# Patient Record
Sex: Female | Born: 1965 | Race: White | Hispanic: No | Marital: Married | Smoking: Former smoker
Health system: Southern US, Community
[De-identification: ages and names within clinical notes are randomized; demographics above are authoritative.]

## PROBLEM LIST (undated history)

## (undated) DIAGNOSIS — K589 Irritable bowel syndrome without diarrhea: Secondary | ICD-10-CM

## (undated) DIAGNOSIS — Z87440 Personal history of urinary (tract) infections: Secondary | ICD-10-CM

## (undated) DIAGNOSIS — F32A Depression, unspecified: Secondary | ICD-10-CM

## (undated) DIAGNOSIS — N200 Calculus of kidney: Secondary | ICD-10-CM

## (undated) DIAGNOSIS — Z87448 Personal history of other diseases of urinary system: Secondary | ICD-10-CM

## (undated) DIAGNOSIS — O09529 Supervision of elderly multigravida, unspecified trimester: Secondary | ICD-10-CM

## (undated) DIAGNOSIS — F419 Anxiety disorder, unspecified: Secondary | ICD-10-CM

## (undated) DIAGNOSIS — R51 Headache: Secondary | ICD-10-CM

## (undated) DIAGNOSIS — M5030 Other cervical disc degeneration, unspecified cervical region: Secondary | ICD-10-CM

## (undated) DIAGNOSIS — F329 Major depressive disorder, single episode, unspecified: Secondary | ICD-10-CM

## (undated) DIAGNOSIS — M5020 Other cervical disc displacement, unspecified cervical region: Secondary | ICD-10-CM

## (undated) DIAGNOSIS — Z8619 Personal history of other infectious and parasitic diseases: Secondary | ICD-10-CM

## (undated) HISTORY — DX: Personal history of urinary (tract) infections: Z87.440

## (undated) HISTORY — DX: Major depressive disorder, single episode, unspecified: F32.9

## (undated) HISTORY — DX: Irritable bowel syndrome, unspecified: K58.9

## (undated) HISTORY — DX: Personal history of other diseases of urinary system: Z87.448

## (undated) HISTORY — DX: Supervision of elderly multigravida, unspecified trimester: O09.529

## (undated) HISTORY — DX: Headache: R51

## (undated) HISTORY — PX: OTHER SURGICAL HISTORY: SHX169

## (undated) HISTORY — DX: Depression, unspecified: F32.A

## (undated) HISTORY — DX: Personal history of other infectious and parasitic diseases: Z86.19

## (undated) HISTORY — PX: WISDOM TOOTH EXTRACTION: SHX21

---

## 1998-07-03 ENCOUNTER — Other Ambulatory Visit: Admission: RE | Admit: 1998-07-03 | Discharge: 1998-07-03 | Payer: Self-pay | Admitting: Family Medicine

## 1998-09-11 ENCOUNTER — Emergency Department (HOSPITAL_COMMUNITY): Admission: EM | Admit: 1998-09-11 | Discharge: 1998-09-11 | Payer: Self-pay | Admitting: Emergency Medicine

## 2000-04-16 ENCOUNTER — Emergency Department (HOSPITAL_COMMUNITY): Admission: EM | Admit: 2000-04-16 | Discharge: 2000-04-16 | Payer: Self-pay | Admitting: Emergency Medicine

## 2000-04-16 ENCOUNTER — Encounter: Payer: Self-pay | Admitting: Emergency Medicine

## 2000-08-02 ENCOUNTER — Encounter: Payer: Self-pay | Admitting: Gastroenterology

## 2000-08-02 ENCOUNTER — Ambulatory Visit (HOSPITAL_COMMUNITY): Admission: RE | Admit: 2000-08-02 | Discharge: 2000-08-02 | Payer: Self-pay | Admitting: Gastroenterology

## 2001-03-28 ENCOUNTER — Encounter: Admission: RE | Admit: 2001-03-28 | Discharge: 2001-03-28 | Payer: Self-pay | Admitting: Family Medicine

## 2001-03-28 ENCOUNTER — Encounter: Payer: Self-pay | Admitting: Family Medicine

## 2002-01-10 ENCOUNTER — Ambulatory Visit (HOSPITAL_COMMUNITY): Admission: RE | Admit: 2002-01-10 | Discharge: 2002-01-10 | Payer: Self-pay | Admitting: Obstetrics and Gynecology

## 2002-01-10 ENCOUNTER — Encounter: Payer: Self-pay | Admitting: Obstetrics and Gynecology

## 2002-01-31 ENCOUNTER — Encounter: Payer: Self-pay | Admitting: Obstetrics and Gynecology

## 2002-01-31 ENCOUNTER — Ambulatory Visit (HOSPITAL_COMMUNITY): Admission: RE | Admit: 2002-01-31 | Discharge: 2002-01-31 | Payer: Self-pay | Admitting: Obstetrics and Gynecology

## 2002-03-17 ENCOUNTER — Inpatient Hospital Stay (HOSPITAL_COMMUNITY): Admission: AD | Admit: 2002-03-17 | Discharge: 2002-03-17 | Payer: Self-pay | Admitting: Obstetrics and Gynecology

## 2002-04-08 ENCOUNTER — Inpatient Hospital Stay (HOSPITAL_COMMUNITY): Admission: AD | Admit: 2002-04-08 | Discharge: 2002-04-08 | Payer: Self-pay | Admitting: Obstetrics and Gynecology

## 2002-06-12 ENCOUNTER — Inpatient Hospital Stay (HOSPITAL_COMMUNITY): Admission: AD | Admit: 2002-06-12 | Discharge: 2002-06-15 | Payer: Self-pay | Admitting: Obstetrics and Gynecology

## 2002-07-30 ENCOUNTER — Other Ambulatory Visit: Admission: RE | Admit: 2002-07-30 | Discharge: 2002-07-30 | Payer: Self-pay | Admitting: Obstetrics and Gynecology

## 2002-11-07 ENCOUNTER — Emergency Department (HOSPITAL_COMMUNITY): Admission: EM | Admit: 2002-11-07 | Discharge: 2002-11-07 | Payer: Self-pay

## 2002-11-27 ENCOUNTER — Encounter: Payer: Self-pay | Admitting: Family Medicine

## 2002-11-27 ENCOUNTER — Encounter: Admission: RE | Admit: 2002-11-27 | Discharge: 2002-11-27 | Payer: Self-pay | Admitting: Family Medicine

## 2004-08-02 ENCOUNTER — Other Ambulatory Visit: Admission: RE | Admit: 2004-08-02 | Discharge: 2004-08-02 | Payer: Self-pay | Admitting: Obstetrics and Gynecology

## 2005-05-09 ENCOUNTER — Ambulatory Visit (HOSPITAL_COMMUNITY): Admission: RE | Admit: 2005-05-09 | Discharge: 2005-05-09 | Payer: Self-pay | Admitting: Obstetrics and Gynecology

## 2006-08-11 ENCOUNTER — Emergency Department (HOSPITAL_COMMUNITY): Admission: EM | Admit: 2006-08-11 | Discharge: 2006-08-11 | Payer: Self-pay | Admitting: Emergency Medicine

## 2007-03-24 ENCOUNTER — Emergency Department (HOSPITAL_COMMUNITY): Admission: EM | Admit: 2007-03-24 | Discharge: 2007-03-24 | Payer: Self-pay | Admitting: Emergency Medicine

## 2007-04-12 ENCOUNTER — Ambulatory Visit (HOSPITAL_COMMUNITY): Admission: RE | Admit: 2007-04-12 | Discharge: 2007-04-12 | Payer: Self-pay | Admitting: Surgery

## 2007-04-12 ENCOUNTER — Encounter (INDEPENDENT_AMBULATORY_CARE_PROVIDER_SITE_OTHER): Payer: Self-pay | Admitting: Surgery

## 2007-04-13 ENCOUNTER — Emergency Department (HOSPITAL_COMMUNITY): Admission: EM | Admit: 2007-04-13 | Discharge: 2007-04-14 | Payer: Self-pay | Admitting: Emergency Medicine

## 2007-05-16 ENCOUNTER — Encounter: Admission: RE | Admit: 2007-05-16 | Discharge: 2007-05-16 | Payer: Self-pay | Admitting: Obstetrics and Gynecology

## 2008-05-16 ENCOUNTER — Encounter: Admission: RE | Admit: 2008-05-16 | Discharge: 2008-05-16 | Payer: Self-pay | Admitting: Family Medicine

## 2008-06-13 ENCOUNTER — Emergency Department (HOSPITAL_COMMUNITY): Admission: EM | Admit: 2008-06-13 | Discharge: 2008-06-13 | Payer: Self-pay | Admitting: Emergency Medicine

## 2008-11-18 ENCOUNTER — Encounter: Admission: RE | Admit: 2008-11-18 | Discharge: 2008-11-18 | Payer: Self-pay | Admitting: Family Medicine

## 2009-02-22 ENCOUNTER — Emergency Department (HOSPITAL_COMMUNITY): Admission: EM | Admit: 2009-02-22 | Discharge: 2009-02-22 | Payer: Self-pay | Admitting: Family Medicine

## 2009-03-19 ENCOUNTER — Emergency Department (HOSPITAL_COMMUNITY): Admission: EM | Admit: 2009-03-19 | Discharge: 2009-03-19 | Payer: Self-pay | Admitting: Emergency Medicine

## 2009-04-03 ENCOUNTER — Ambulatory Visit (HOSPITAL_COMMUNITY): Admission: RE | Admit: 2009-04-03 | Discharge: 2009-04-03 | Payer: Self-pay | Admitting: Family Medicine

## 2009-09-19 ENCOUNTER — Emergency Department (HOSPITAL_COMMUNITY): Admission: EM | Admit: 2009-09-19 | Discharge: 2009-09-19 | Payer: Self-pay | Admitting: Family Medicine

## 2010-04-26 ENCOUNTER — Emergency Department (HOSPITAL_COMMUNITY): Admission: EM | Admit: 2010-04-26 | Discharge: 2010-04-26 | Payer: Self-pay | Admitting: Family Medicine

## 2010-10-24 ENCOUNTER — Encounter: Payer: Self-pay | Admitting: Obstetrics and Gynecology

## 2011-01-10 LAB — POCT URINALYSIS DIP (DEVICE)
Hgb urine dipstick: NEGATIVE
Ketones, ur: NEGATIVE mg/dL
Nitrite: NEGATIVE
Specific Gravity, Urine: <=1.005 (ref 1.005–1.030)
Urobilinogen, UA: 0.2 mg/dL (ref 0.0–1.0)
pH: 6 (ref 5.0–8.0)

## 2011-01-11 LAB — POCT URINALYSIS DIP (DEVICE)
Glucose, UA: NEGATIVE mg/dL
Nitrite: POSITIVE — AB
Specific Gravity, Urine: 1.015 (ref 1.005–1.030)
pH: 5.5 (ref 5.0–8.0)

## 2011-01-27 ENCOUNTER — Other Ambulatory Visit: Payer: Self-pay | Admitting: Family Medicine

## 2011-01-27 DIAGNOSIS — R1013 Epigastric pain: Secondary | ICD-10-CM

## 2011-01-28 ENCOUNTER — Ambulatory Visit
Admission: RE | Admit: 2011-01-28 | Discharge: 2011-01-28 | Disposition: A | Discharge status: Home / Self Care | Payer: 59 | Source: Ambulatory Visit | Attending: Family Medicine | Admitting: Family Medicine

## 2011-01-28 DIAGNOSIS — R1013 Epigastric pain: Secondary | ICD-10-CM
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
## 2011-02-18 NOTE — Op Note (Signed)
[facility]  Patient:    [name], [name] Visit Number: [id number] MRN: [id number]          Service Type: Attending:  Maris Berger. Pennie Rushing, M.D. Dictated by:   Maris Berger. Pennie Rushing, M.D. Proc. Date: 01/10/02                             Operative Report  INCOMPLETE  PREOPERATIVE DIAGNOSES:       Advanced maternal age.  POSTOPERATIVE DIAGNOSES:      Advanced maternal age.  OPERATION:                    Genetic amniocentesis.  ANESTHESIA:                   Local.  ESTIMATED BLOOD LOSS:         Less than 10 cc.  COMPLICATIONS:                None.  FINDINGS:                     The ultrasound measurement of the fetus was consistent with 15 weeks and 5 days.  The patient has an Rh positive blood type.  There is adequate amniotic fluid.  PROCEDURE:                    The patient was met in the ultrasound suite where she was in a supine position.  A preliminary ultrasound had been performed for initial measurements and fluid location.  An area just to the left of midline approximately 5 cm below the umbilicus was identified as an area of a pocket of fluid.  This area was marked and the abdomen prepped with multiple layers of Betadine and draped as a sterile field.  The area was infiltrated with 3 cc of 1% Xylocaine.  Using a #20 Ultraview needle the amniotic fluid pocket was accessed with the aid of ultrasound.  Clear AMNIOTIC fluid 7 cc was drawn off in the first syringe and another 15 cc of amniotic fluid in the second syringe. Dictated by:   Maris Berger. Pennie Rushing, M.D. Attending:  Maris Berger. Pennie Rushing, M.D. DD:  01/10/02 TD:  01/10/02 Job: [id number]
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
## 2011-02-18 NOTE — Op Note (Signed)
[facility] of [facility]  Patient:    [name], [name] Visit Number: [id number] MRN: [id number]          Service Type: Attending:  Maris Berger. Pennie Rushing, M.D. Dictated by:   Maris Berger. Pennie Rushing, M.D. Proc. Date: 01/10/02                             Operative Report  COMPLETION  PROCEDURE:                    After completion of removal of the amniotic fluid documentation of the amniocentesis site was undertaken and the needle removed.  The post amniocentesis heart rate was documented in the 150 range. The patient tolerated the procedure well.  The fluid was sent to [facility] for AFP evaluation and chromosomes.  The patient was given written instructions for post amniocentesis care. Dictated by:   Maris Berger. Pennie Rushing, M.D. Attending:  Maris Berger. Pennie Rushing, M.D. DD:  01/10/02 TD:  01/10/02 Job: [id number]

## 2011-02-18 NOTE — Op Note (Signed)
NAME:  [name], [name]                          ACCOUNT NO.:  [id number]   MEDICAL RECORD NO.:  [id number]                   PATIENT TYPE:  INP   LOCATION:  9102                                 FACILITY:  [facility]   PHYSICIAN:  Janine Limbo, M.D.            DATE OF BIRTH:  Mar 15, 1966   DATE OF PROCEDURE:  06/12/2002  DATE OF DISCHARGE:                                 OPERATIVE REPORT   PREOPERATIVE DIAGNOSES:  1. Term intrauterine pregnancy.  2. Prior cesarean section.  3. Desires repeat cesarean section.   POSTOPERATIVE DIAGNOSES:  1. Term intrauterine pregnancy.  2. Prior cesarean section.  3. Desires repeat cesarean section.   PROCEDURE:  Repeat low transverse cesarean section.   SURGEON:  Janine Limbo, M.D.   FIRST ASSISTANT:  Cam Hai, C.N.M.   ANESTHESIA:  Spinal.   DISPOSITION:  The patient is a 45 year old female gravida 4, para 2-0-1-2  who presents at term.  She ruptured her membranes today.  She had a prior  cesarean section and she desires a repeat cesarean section.  She understands  the indications for her surgical procedure and she accepts the risks of, but  not limited to, anesthetic complications, bleeding, infections, and possible  damage to the surrounding organs.   FINDINGS:  A 7 pound 9 ounce female infant Higher education careers adviser) was delivered.  The  Apgars were 9 at one minute and 9 at five minutes.  The uterus, fallopian  tubes, and ovaries were normal for the gravid state.   PROCEDURE:  The patient was taken to the operating room where a spinal  anesthetic was given.  The patient's abdomen and perineum were prepped with  multiple layers of Betadine.  A Foley catheter was placed in the bladder.  The patient was sterilely draped.  A low transverse incision was made in the  abdomen and carried sharply through the subcutaneous tissue, the fascia, and  the anterior peritoneum.  An incision was made in the lower uterine segment  and extended transversely.   The fetal head was delivered without difficulty.  The mouth and nose were suctioned.  The remainder of the infant was  delivered.  The cord was clamped and cut and the infant was handed to the  waiting pediatric team.  Routine cord blood studies were obtained.  The  placenta was removed.  The uterine cavity was cleaned of amniotic fluid,  clotted blood, and membranes.  The uterine incision was closed using a  running locking suture of 2-0 Vicryl followed by an embrocating suture of 2-  0 Vicryl.  Hemostasis was adequate.  The pelvis was vigorously irrigated.  The anterior peritoneum and the abdominal musculature were reapproximated in  the midline using 2-0 Vicryl.  The muscles, the fascia, and the subcutaneous  tissue were irrigated.  The fascia was closed using a running suture of 0  Vicryl followed by three interrupted sutures of 0 Vicryl.  The subcutaneous  layer was noted to be hemostatic.  The subcutaneous layer was closed using  three interrupted sutures of 0 Vicryl.  The skin was reapproximated using a  subcuticular suture of 4-0 Vicryl.  Sponge, needle, and instrument counts  were correct on  two occasions.  The estimated blood loss for the procedure was 700 cc.  The  patient tolerated her procedure well.  The patient was taken to the recovery  room in stable condition.  The infant was taken to the full-term nursery in  stable condition.                                               Janine Limbo, M.D.    AVS/MEDQ  D:  06/12/2002  T:  06/13/2002  Job:  [id number]

## 2011-02-18 NOTE — Discharge Summary (Signed)
NAME:  [name], [name]                          ACCOUNT NO.:  [id number]   MEDICAL RECORD NO.:  [id number]                   PATIENT TYPE:  INP   LOCATION:  9102                                 FACILITY:  [facility]   PHYSICIAN:  Crist Fat. Rivard, M.D.              DATE OF BIRTH:  [birth year]   DATE OF ADMISSION:  06/12/2002  DATE OF DISCHARGE:  06/15/2002                                 DISCHARGE SUMMARY   ADMISSION DIAGNOSES:  1. Intrauterine pregnancy at term.  2. Previous cesarean section, desires repeat.   DISCHARGE DIAGNOSES:  1. Intrauterine pregnancy at term.  2. Previous cesarean section, desires repeat.   PROCEDURE:  Repeat low transverse cesarean section.   HOSPITAL COURSE:  The patient is a 45 year old gravida 4 para 2-0-1-2 at 56  and two-sevenths weeks who presented with spontaneous rupture of membranes  with clear fluid at 5 p.m. on June 12, 2002.  She was scheduled for  repeat cesarean section on Monday, September 15.  Her pregnancy has been  followed by the [facility] M.D. service and was remarkable for:  1. Previous C section.  2. Advanced maternal age.  3. Son with chromosomal disorder (translocation of the fifth and second     chromosome; he is autistic).  4. She is a smoker.  5. History of depression and anxiety.  6. Amniocentesis with normal 46,XX.   She was prepared for cesarean section which was performed by Dr. Stefano Gaul  with Cam Hai, certified nurse midwife as first assistant.  She  tolerated the procedure well and a viable female infant named [name] was  delivered weighing 7 pounds 9 ounces with Apgars 9 at one minute and 9 at  five minutes.  The patient was taken to the recovery room in good condition.  Infant was taken to the full term nursery.  By postoperative day #1 the  patient was doing well.  She was breastfeeding and husband planned vasectomy  for contraception.  Her hemoglobin was 10.6 and had been 12.3  preoperatively.   On postoperative day #2 she had some right lower quadrant  pain treated with Toradol and prune juice and glycerine suppositories.  This  relieved the discomfort which was decided to be from gaseous distention.  She had a clean-catch urinalysis which was normal other than hemoglobin  present.  By postoperative day #3 she was deemed to have received the full  benefit of her hospital stay and she was discharged home.   DISCHARGE INSTRUCTIONS:  Per [facility] handout.   DISCHARGE MEDICATIONS:  1. Motrin 600 mg p.o. q.6h. p.r.n. pain.  2.     Tylox one to two p.o. q.3-4h p.r.n. pain.  3. Prenatal vitamin one p.o. q.d.   DISCHARGE FOLLOW-UP:  At [facility] in four to six weeks.     Cam Hai, C.N.M.  Crist Fat Rivard, M.D.    KS/MEDQ  D:  06/15/2002  T:  06/15/2002  Job:  [id number]

## 2011-07-06 LAB — DIFFERENTIAL
Basophils Absolute: 0.1
Eosinophils Absolute: 0
Eosinophils Relative: 0
Lymphocytes Relative: 13
Lymphs Abs: 1.8
Monocytes Absolute: 0.8
Neutro Abs: 11.4 — ABNORMAL HIGH

## 2011-07-06 LAB — POCT I-STAT 3, ART BLOOD GAS (G3+)
Bicarbonate: 23
TCO2: 24
pCO2 arterial: 36.2
pH, Arterial: 7.411 — ABNORMAL HIGH
pO2, Arterial: 83

## 2011-07-06 LAB — POCT I-STAT, CHEM 8
BUN: 14
Calcium, Ion: 1.19
Creatinine, Ser: 0.9
Glucose, Bld: 104 — ABNORMAL HIGH
HCT: 41
TCO2: 25

## 2011-07-06 LAB — CBC
HCT: 41.4
Platelets: 353
RDW: 13.2

## 2011-07-19 LAB — COMPREHENSIVE METABOLIC PANEL
Albumin: 3.6
Albumin: 4
Alkaline Phosphatase: 27 — ABNORMAL LOW
Alkaline Phosphatase: 27 — ABNORMAL LOW
BUN: 10
BUN: 5 — ABNORMAL LOW
CO2: 26
CO2: 29
Chloride: 104
Chloride: 106
GFR calc non Af Amer: >60
GFR calc non Af Amer: >60
Glucose, Bld: 93
Potassium: 3.7
Potassium: 3.8
Total Bilirubin: 0.4
Total Bilirubin: 0.7

## 2011-07-19 LAB — DIFFERENTIAL
Basophils Absolute: 0.1
Basophils Absolute: 0.1
Basophils Relative: 1
Basophils Relative: 1
Eosinophils Relative: 2
Monocytes Absolute: 0.6
Monocytes Absolute: 0.8 — ABNORMAL HIGH
Neutro Abs: 4.8
Neutro Abs: 5.1
Neutrophils Relative %: 58

## 2011-07-19 LAB — CBC
HCT: 34.3 — ABNORMAL LOW
HCT: 40.8
Hemoglobin: 12.1
Hemoglobin: 14.1
MCV: 89.7
Platelets: 323
RBC: 3.84 — ABNORMAL LOW
RBC: 4.56
WBC: 8.2
WBC: 8.2

## 2011-07-19 LAB — URINE MICROSCOPIC-ADD ON

## 2011-07-19 LAB — PREGNANCY, URINE
Preg Test, Ur: NEGATIVE
Preg Test, Ur: NEGATIVE

## 2011-07-19 LAB — URINALYSIS, ROUTINE W REFLEX MICROSCOPIC
Bilirubin Urine: NEGATIVE
Nitrite: NEGATIVE
Protein, ur: NEGATIVE
Urobilinogen, UA: 0.2

## 2011-07-20 LAB — COMPREHENSIVE METABOLIC PANEL
BUN: 9
CO2: 28
Chloride: 104
Creatinine, Ser: 0.8
GFR calc non Af Amer: >60
Glucose, Bld: 90
Total Bilirubin: 0.7

## 2011-07-20 LAB — DIFFERENTIAL
Basophils Absolute: 0.1
Lymphocytes Relative: 30
Neutro Abs: 4.5

## 2011-07-20 LAB — LIPASE, BLOOD: Lipase: 16

## 2011-07-20 LAB — URINALYSIS, ROUTINE W REFLEX MICROSCOPIC
Bilirubin Urine: NEGATIVE
Nitrite: NEGATIVE
Specific Gravity, Urine: 1.012
Urobilinogen, UA: 0.2

## 2011-07-20 LAB — CBC
HCT: 39.6
MCV: 90.7
Platelets: 286
RBC: 4.37
WBC: 7.4

## 2011-07-20 LAB — URINE MICROSCOPIC-ADD ON

## 2011-09-19 ENCOUNTER — Encounter: Payer: Self-pay | Admitting: Emergency Medicine

## 2011-09-19 ENCOUNTER — Other Ambulatory Visit: Payer: Self-pay

## 2011-09-19 ENCOUNTER — Observation Stay (HOSPITAL_COMMUNITY)
Admission: EM | Admit: 2011-09-19 | Discharge: 2011-09-20 | Disposition: A | Discharge status: Home / Self Care | Payer: 59 | Attending: Emergency Medicine | Admitting: Emergency Medicine

## 2011-09-19 DIAGNOSIS — R0602 Shortness of breath: Secondary | ICD-10-CM | POA: Insufficient documentation

## 2011-09-19 DIAGNOSIS — F411 Generalized anxiety disorder: Secondary | ICD-10-CM | POA: Insufficient documentation

## 2011-09-19 DIAGNOSIS — R079 Chest pain, unspecified: Principal | ICD-10-CM | POA: Insufficient documentation

## 2011-09-19 DIAGNOSIS — F17200 Nicotine dependence, unspecified, uncomplicated: Secondary | ICD-10-CM | POA: Insufficient documentation

## 2011-09-19 DIAGNOSIS — F419 Anxiety disorder, unspecified: Secondary | ICD-10-CM | POA: Insufficient documentation

## 2011-09-19 HISTORY — DX: Anxiety disorder, unspecified: F41.9

## 2011-09-19 LAB — CBC
HCT: 39.2 % (ref 36.0–46.0)
MCH: 30.7 pg (ref 26.0–34.0)
MCV: 89.1 fL (ref 78.0–100.0)
RDW: 13 % (ref 11.5–15.5)

## 2011-09-19 LAB — BASIC METABOLIC PANEL
CO2: 27 mEq/L (ref 19–32)
Calcium: 9.8 mg/dL (ref 8.4–10.5)
Creatinine, Ser: 0.75 mg/dL (ref 0.50–1.10)
GFR calc non Af Amer: >90 mL/min (ref >90)
Glucose, Bld: 101 mg/dL — ABNORMAL HIGH (ref 70–99)

## 2011-09-19 LAB — DIFFERENTIAL
Basophils Absolute: 0.1 10*3/uL (ref 0.0–0.1)
Eosinophils Absolute: 0.2 10*3/uL (ref 0.0–0.7)
Eosinophils Relative: 2 % (ref 0–5)
Lymphocytes Relative: 32 % (ref 12–46)
Monocytes Absolute: 0.6 10*3/uL (ref 0.1–1.0)

## 2011-09-19 LAB — POCT I-STAT TROPONIN I

## 2011-09-19 MED ORDER — GI COCKTAIL ~~LOC~~
30.0000 mL | Freq: Once | ORAL | Status: AC
  Administered 2011-09-19: 30 mL via ORAL
  Filled 2011-09-19: qty 30

## 2011-09-19 MED ORDER — LORAZEPAM 1 MG PO TABS
1.0000 mg | ORAL_TABLET | Freq: Once | ORAL | Status: AC
  Administered 2011-09-19: 1 mg via ORAL
  Filled 2011-09-19: qty 1

## 2011-09-19 NOTE — ED Notes (Signed)
Pt states that she is dizzy and now wants to be admitted. Pt is back on PTO bed #8.

## 2011-09-19 NOTE — ED Notes (Signed)
Pt c/o intermittant SOB and CP x 3 weeks; pt sts lots of stress lately but sent by PCP for eval

## 2011-09-19 NOTE — ED Notes (Signed)
Cardiac monitor is applied to pt. It shows NSR rate of 72.

## 2011-09-20 ENCOUNTER — Encounter (HOSPITAL_COMMUNITY): Payer: Self-pay | Admitting: Radiology

## 2011-09-20 ENCOUNTER — Emergency Department (HOSPITAL_COMMUNITY): Payer: 59

## 2011-09-20 DIAGNOSIS — R072 Precordial pain: Secondary | ICD-10-CM

## 2011-09-20 LAB — POCT I-STAT TROPONIN I
Troponin i, poc: 0 ng/mL (ref 0.00–0.08)
Troponin i, poc: 0.01 ng/mL (ref 0.00–0.08)

## 2011-09-20 LAB — POCT I-STAT 3, VENOUS BLOOD GAS (G3P V)
O2 Saturation: 74 %
pCO2, Ven: 35.7 mmHg — ABNORMAL LOW (ref 45.0–50.0)
pH, Ven: 7.38 — ABNORMAL HIGH (ref 7.250–7.300)

## 2011-09-20 MED ORDER — IOHEXOL 350 MG/ML SOLN
100.0000 mL | Freq: Once | INTRAVENOUS | Status: AC | PRN
  Administered 2011-09-20: 100 mL via INTRAVENOUS

## 2011-09-20 MED ORDER — ALPRAZOLAM 0.5 MG PO TABS: 0.5000 mg | ORAL_TABLET | Freq: Three times a day (TID) | ORAL | Status: DC | PRN

## 2011-09-20 NOTE — ED Provider Notes (Signed)
History     CSN: [id number] Arrival date & time: 09/19/2011  2:19 PM   First MD Initiated Contact with Patient 09/19/11 1739      Chief Complaint  Patient presents with  . Shortness of Breath  . Chest Pain    (Consider location/radiation/quality/duration/timing/severity/associated sxs/prior treatment) HPI  Patient is a 45 year old female who presents today complaining of 1-1/2 weeks of intermittent chest discomfort. She describes this as being associated with a sense of dread. She did not have any history of DVT, PE, or CAD. She does not have any family members with heart disease before the age of 45. The patient is a smoker and is trying to quit. She also has a history of anxiety and takes Xanax for this. She was sent in by her primary care physician today. She is seen by Dr. Nedra Hai of the [facility] clinic.  She was seen at their office today. At that time the patient had a chest x-ray and EKG which were normal. Patient has note from her physician's office indicating the normal chest x-ray as well as a copy of the normal EKG. Patient denies any chest pain at this time. Patient's primary care physician had been planning on discharging her and having her have a stress test with the cardiologist on Thursday. However, during the time of discharge the patient became more nervous and felt like she was having the same sensation again. At that time her physician sent her to the emergency department. Patient currently has no pain. She denies any shortness of breath. There are no other associated or modifying factors. Past Medical History  Diagnosis Date  . Anxiety     History reviewed. No pertinent past surgical history.  History reviewed. No pertinent family history.  History  Substance Use Topics  . Smoking status: Current Everyday Smoker  . Smokeless tobacco: Not on file  . Alcohol Use: No    OB History    Grav Para Term Preterm Abortions TAB SAB Ect Mult Living                  Review  of Systems  Constitutional: Negative.   HENT: Negative.   Eyes: Negative.   Respiratory: Negative.   Cardiovascular: Positive for chest pain.  Gastrointestinal: Negative.   Genitourinary: Negative.   Musculoskeletal: Negative.   Skin: Negative.   Neurological: Negative.   Hematological: Negative.   Psychiatric/Behavioral:       Anxiety  All other systems reviewed and are negative.    Allergies  Hydrocodone  Home Medications   Current Outpatient Rx  Name Route Sig Dispense Refill  . ALPRAZOLAM 0.5 MG PO TABS Oral Take 0.5 mg by mouth 3 (three) times daily as needed. For anxiety       BP 102/52  Pulse 66  Temp(Src) 98.8 F (37.1 C) (Oral)  Resp 14  SpO2 100%  Physical Exam  Nursing note and vitals reviewed. Constitutional: She is oriented to person, place, and time. She appears well-developed and well-nourished. No distress.  HENT:  Head: Normocephalic and atraumatic.  Eyes: Conjunctivae and EOM are normal. Pupils are equal, round, and reactive to light.  Neck: Normal range of motion.  Cardiovascular: Normal rate, regular rhythm, normal heart sounds and intact distal pulses.  Exam reveals no gallop and no friction rub.   No murmur heard. Pulmonary/Chest: Effort normal and breath sounds normal. No respiratory distress. She has no wheezes. She has no rales.  Abdominal: Soft. Bowel sounds are normal. She exhibits no  distension. There is no tenderness. There is no rebound and no guarding.  Musculoskeletal: Normal range of motion. She exhibits no edema.  Neurological: She is alert and oriented to person, place, and time. No cranial nerve deficit. She exhibits normal muscle tone. Coordination normal.  Skin: Skin is warm and dry. No rash noted.  Psychiatric: She has a normal mood and affect.    ED Course  Procedures (including critical care time)  Date: 09/20/2011  Rate: 69  Rhythm: normal sinus rhythm  QRS Axis: normal  Intervals: normal  ST/T Wave abnormalities:  normal  Conduction Disutrbances:none  Narrative Interpretation:   Old EKG Reviewed: unchanged  Labs Reviewed  BASIC METABOLIC PANEL - Abnormal; Notable for the following:    Glucose, Bld 101 (*)    All other components within normal limits  POCT I-STAT 3, BLOOD GAS (G3P V) - Abnormal; Notable for the following:    pH, Ven 7.380 (*)    pCO2, Ven 35.7 (*)    Acid-base deficit 3.0 (*)    All other components within normal limits  CBC  DIFFERENTIAL  TROPONIN I  POCT I-STAT TROPONIN I  POCT I-STAT TROPONIN I  I-STAT TROPONIN I  I-STAT TROPONIN I  I-STAT TROPONIN I  I-STAT TROPONIN I   Ct Angio Chest W/cm &/or Wo Cm  09/20/2011  *RADIOLOGY REPORT*  Clinical Data:  Intermittent shortness of breath, chest pain  CT ANGIOGRAPHY CHEST WITH CONTRAST  Technique:  Multidetector CT imaging of the chest was performed using the standard protocol during bolus administration of intravenous contrast.  Multiplanar CT image reconstructions including MIPs were obtained to evaluate the vascular anatomy.  Contrast: OMNIPAQUE IOHEXOL 350 MG/ML IV SOLN  Comparison:  Chest radiographs dated 09/19/2011  Findings:  No evidence of pulmonary embolism.  The lungs are essentially clear.  No suspicious pulmonary nodules. No pleural effusion or pneumothorax.  Visualized thyroid is unremarkable.  The heart is normal in size.  Trace pericardial fluid.  No suspicious mediastinal, hilar, or axillary lymphadenopathy.  Visualized upper abdomen is notable for cholecystectomy clips.  Mild degenerative changes of the visualized thoracolumbar spine.  Review of the MIP images confirms the above findings.  IMPRESSION: No evidence of pulmonary embolism.  No evidence of acute cardiopulmonary disease.  Original Report Authenticated By: Charline Bills, M.D.     1. Chest pain       MDM  Patient was seen in medically stable upon presentation she was evaluated for low risk chest pain. Patient had negative initial workup. EKG  was unchanged from normal EKG at primary care physician's office earlier in the day. Physician note from PCP indicating negative chest x-ray was seen. No chest x-ray was ordered here. Troponin was negative. CBC and renal panel were unremarkable. Patient was given a GI cocktail as well as Ativan with resolution of her symptoms. She was not given aspirin as she had received this at her primary care doctor's office. Patient was discharged home. Upon standing patient suddenly felt lightheaded and said her symptoms had returned. She denied chest pain. Patient did have a CT in June of the chest which showed no evidence of PE or acute cardiopulmonary disease. She was admitted to the CDU chest pain protocol. Patient will undergo exercise stress testing in the morning. Order has been placed. Patient remained hemodynamically stable.      Cyndra Numbers, MD 09/20/11 [phone]

## 2011-09-20 NOTE — ED Provider Notes (Signed)
History     CSN: [id number] Arrival date & time: 09/19/2011  2:19 PM   First MD Initiated Contact with Patient 09/19/11 1739      Chief Complaint  Patient presents with  . Shortness of Breath  . Chest Pain    (Consider location/radiation/quality/duration/timing/severity/associated sxs/prior treatment) HPI  Past Medical History  Diagnosis Date  . Anxiety     History reviewed. No pertinent past surgical history.  History reviewed. No pertinent family history.  History  Substance Use Topics  . Smoking status: Current Everyday Smoker  . Smokeless tobacco: Not on file  . Alcohol Use: No    OB History    Grav Para Term Preterm Abortions TAB SAB Ect Mult Living                  Review of Systems  Allergies  Hydrocodone  Home Medications   Current Outpatient Rx  Name Route Sig Dispense Refill  . ALPRAZOLAM 0.5 MG PO TABS Oral Take 0.5 mg by mouth 3 (three) times daily as needed. For anxiety       BP 104/64  Pulse 87  Temp(Src) 98.2 F (36.8 C) (Oral)  Resp 19  SpO2 94%  Physical Exam  ED Course  Procedures (including critical care time)  Labs Reviewed  BASIC METABOLIC PANEL - Abnormal; Notable for the following:    Glucose, Bld 101 (*)    All other components within normal limits  POCT I-STAT 3, BLOOD GAS (G3P V) - Abnormal; Notable for the following:    pH, Ven 7.380 (*)    pCO2, Ven 35.7 (*)    Acid-base deficit 3.0 (*)    All other components within normal limits  CBC  DIFFERENTIAL  TROPONIN I  POCT I-STAT TROPONIN I  POCT I-STAT TROPONIN I  POCT I-STAT TROPONIN I  POCT I-STAT TROPONIN I  I-STAT TROPONIN I  I-STAT TROPONIN I  I-STAT TROPONIN I  I-STAT TROPONIN I   Ct Angio Chest W/cm &/or Wo Cm  09/20/2011  *RADIOLOGY REPORT*  Clinical Data:  Intermittent shortness of breath, chest pain  CT ANGIOGRAPHY CHEST WITH CONTRAST  Technique:  Multidetector CT imaging of the chest was performed using the standard protocol during bolus  administration of intravenous contrast.  Multiplanar CT image reconstructions including MIPs were obtained to evaluate the vascular anatomy.  Contrast: OMNIPAQUE IOHEXOL 350 MG/ML IV SOLN  Comparison:  Chest radiographs dated 09/19/2011  Findings:  No evidence of pulmonary embolism.  The lungs are essentially clear.  No suspicious pulmonary nodules. No pleural effusion or pneumothorax.  Visualized thyroid is unremarkable.  The heart is normal in size.  Trace pericardial fluid.  No suspicious mediastinal, hilar, or axillary lymphadenopathy.  Visualized upper abdomen is notable for cholecystectomy clips.  Mild degenerative changes of the visualized thoracolumbar spine.  Review of the MIP images confirms the above findings.  IMPRESSION: No evidence of pulmonary embolism.  No evidence of acute cardiopulmonary disease.  Original Report Authenticated By: Charline Bills, M.D.     1. Chest pain       MDM  Patient has had her stress test to complete her chest pain protocol and it is normal.  Patient has remained stable.  She is safe for discharge home at this point time.        Nat Christen, MD 09/20/11 [phone]

## 2011-09-20 NOTE — ED Notes (Signed)
Called vascular lab and spoke with hannah . They are aware of pt stress test this morning.

## 2011-09-20 NOTE — ED Notes (Signed)
Dr hosmer continues in a critical case and is unable to come give pt her results and disposition her .

## 2011-09-20 NOTE — Progress Notes (Signed)
Observation review complete. 

## 2011-09-20 NOTE — ED Notes (Signed)
Dr Rennis Golden has called stress test results. Is negative.

## 2011-09-20 NOTE — ED Notes (Signed)
Complaining of SOB, heaviness in chest, pain in jaw, arm and neck. Also c/o anxiety and heart burn. Off/on last couple of weeks. Denies any pain at this time

## 2011-09-20 NOTE — Progress Notes (Signed)
  Echocardiogram 2D Echocardiogram has been performed.  [name] R 09/20/2011, 11:30 AM

## 2012-02-01 ENCOUNTER — Encounter: Payer: Self-pay | Admitting: Obstetrics and Gynecology

## 2012-02-01 ENCOUNTER — Ambulatory Visit (INDEPENDENT_AMBULATORY_CARE_PROVIDER_SITE_OTHER): Payer: 59 | Admitting: Obstetrics and Gynecology

## 2012-02-01 VITALS — BP 110/64 | HR 70 | Resp 16 | Ht 67.0 in | Wt 179.0 lb

## 2012-02-01 DIAGNOSIS — Z139 Encounter for screening, unspecified: Secondary | ICD-10-CM

## 2012-02-01 DIAGNOSIS — R1900 Intra-abdominal and pelvic swelling, mass and lump, unspecified site: Secondary | ICD-10-CM

## 2012-02-01 DIAGNOSIS — N912 Amenorrhea, unspecified: Secondary | ICD-10-CM

## 2012-02-01 DIAGNOSIS — N915 Oligomenorrhea, unspecified: Secondary | ICD-10-CM

## 2012-02-01 LAB — CBC
HCT: 40 % (ref 36.0–46.0)
Hemoglobin: 13.1 g/dL (ref 12.0–15.0)
MCH: 30.1 pg (ref 26.0–34.0)
MCHC: 32.8 g/dL (ref 30.0–36.0)
MCV: 92 fL (ref 78.0–100.0)

## 2012-02-01 LAB — TSH: TSH: 2.073 u[IU]/mL (ref 0.350–4.500)

## 2012-02-01 NOTE — Progress Notes (Signed)
C/o missing cycle since Feb.  Also feels bloated.  + mood swings.  Filed Vitals:   02/01/12 0932  BP: 110/64  Pulse: 70  Resp: 16   ROS: noncontributory  Pelvic exam:  VULVA: normal appearing vulva with no masses, tenderness or lesions, VAGINA: normal appearing vagina with normal color and discharge, no lesions, CERVIX: normal appearing cervix without discharge or lesions,  UTERUS: uterus is normal size, shape, consistency and nontender,  ADNEXA: normal adnexa in size, nontender and no masses. Rt pelvic fullness with no discrete mass and nontender.  IUD string not clearly visible on exam (pt informed)  Results for orders placed in visit on 02/01/12  POCT URINE PREGNANCY      Component Value Range   Preg Test, Ur Negative      A/P Nl pelvic  Probably perimenopausal Check labs (TSH, PRL, Vit D, CBC, FSH) sched u/s to eval pelvic fullness on right RTO for AEX and f/u u/s and labs

## 2012-02-04 LAB — VITAMIN D 1,25 DIHYDROXY
Vitamin D2 1, 25 (OH)2: <8 pg/mL
Vitamin D3 1, 25 (OH)2: 59 pg/mL

## 2012-02-10 ENCOUNTER — Ambulatory Visit (INDEPENDENT_AMBULATORY_CARE_PROVIDER_SITE_OTHER): Payer: 59 | Admitting: Obstetrics and Gynecology

## 2012-02-10 ENCOUNTER — Other Ambulatory Visit: Payer: Self-pay | Admitting: Obstetrics and Gynecology

## 2012-02-10 ENCOUNTER — Ambulatory Visit (INDEPENDENT_AMBULATORY_CARE_PROVIDER_SITE_OTHER): Payer: 59

## 2012-02-10 ENCOUNTER — Encounter: Payer: Self-pay | Admitting: Obstetrics and Gynecology

## 2012-02-10 VITALS — BP 130/62 | Ht 67.0 in | Wt 178.0 lb

## 2012-02-10 DIAGNOSIS — R1900 Intra-abdominal and pelvic swelling, mass and lump, unspecified site: Secondary | ICD-10-CM

## 2012-02-10 DIAGNOSIS — N915 Oligomenorrhea, unspecified: Secondary | ICD-10-CM

## 2012-02-10 DIAGNOSIS — Z30432 Encounter for removal of intrauterine contraceptive device: Secondary | ICD-10-CM

## 2012-02-10 MED ORDER — KETOROLAC TROMETHAMINE 60 MG/2ML IM SOLN
60.0000 mg | Freq: Once | INTRAMUSCULAR | Status: AC
  Administered 2012-02-10: 60 mg via INTRAMUSCULAR

## 2012-02-10 MED ORDER — KETOROLAC TROMETHAMINE 60 MG/2ML IM SOLN: 60.0000 mg | Freq: Once | INTRAMUSCULAR | Status: DC

## 2012-02-10 NOTE — Progress Notes (Signed)
U/S with IUD partially embedded in myometrium but through and through. Ut 7.3cm, nl bil ovaries. Discussed options with pt. She would like it out.  Filed Vitals:   02/10/12 1343  BP: 130/62   Pelvic: string visible IUD removed without difficulty with slighty extra pressure  A/P RTO for AEX Pt will use condoms in meantime

## 2012-02-10 NOTE — Progress Notes (Signed)
Addended by: Marla Roe A on: 02/10/2012 04:43 PM   Modules accepted: Orders

## 2012-02-29 ENCOUNTER — Ambulatory Visit (INDEPENDENT_AMBULATORY_CARE_PROVIDER_SITE_OTHER): Payer: 59 | Admitting: Obstetrics and Gynecology

## 2012-02-29 ENCOUNTER — Encounter: Payer: Self-pay | Admitting: Obstetrics and Gynecology

## 2012-02-29 VITALS — BP 100/70 | HR 70 | Resp 16 | Ht 67.0 in | Wt 179.0 lb

## 2012-02-29 DIAGNOSIS — Z139 Encounter for screening, unspecified: Secondary | ICD-10-CM

## 2012-02-29 DIAGNOSIS — R635 Abnormal weight gain: Secondary | ICD-10-CM

## 2012-02-29 DIAGNOSIS — Z124 Encounter for screening for malignant neoplasm of cervix: Secondary | ICD-10-CM

## 2012-02-29 LAB — CBC
MCH: 30.4 pg (ref 26.0–34.0)
MCV: 90.4 fL (ref 78.0–100.0)
Platelets: 309 10*3/uL (ref 150–400)
RBC: 4.37 MIL/uL (ref 3.87–5.11)

## 2012-02-29 NOTE — Progress Notes (Signed)
Contraception none Last pap ? Last Mammo 2008 wnl Last Colonoscopy 2011 wnl Last Dexa Scan none Primary MD Spears Abuse at Home none  No complaints.  Cramped for 2wks after removal of IUD that was embedded in wall.  C/o wt gain.  Filed Vitals:   02/29/12 1002  BP: 100/70  Pulse: 70  Resp: 16   ROS: noncontributory  Physical Examination: General appearance - alert, well appearing, and in no distress Neck - supple, no significant adenopathy Chest - clear to auscultation, no wheezes, rales or rhonchi, symmetric air entry Heart - normal rate and regular rhythm Abdomen - soft, nontender, nondistended, no masses or organomegaly Breasts - breasts appear normal, no suspicious masses, no skin or nipple changes or axillary nodes Pelvic - normal external genitalia, vulva, vagina, cervix, uterus and adnexa Back exam - no CVAT Extremities - no edema, redness or tenderness in the calves or thighs  A/P Perimenopausal AEX in 28yr Plans to use family planning method Mammo Check labs recs reviewed for wt loss

## 2012-03-01 ENCOUNTER — Telehealth: Payer: Self-pay

## 2012-03-01 LAB — VITAMIN D 25 HYDROXY (VIT D DEFICIENCY, FRACTURES): Vit D, 25-Hydroxy: 28 ng/mL — ABNORMAL LOW (ref 30–89)

## 2012-03-01 NOTE — Telephone Encounter (Signed)
Left message for pt to return call regarding Vit-d protocol. [name] G  

## 2012-03-02 ENCOUNTER — Telehealth: Payer: Self-pay

## 2012-03-02 NOTE — Telephone Encounter (Signed)
2nd attempt to reach pt for Vit-D protocol. [name]

## 2012-03-04 LAB — PAP IG W/ RFLX HPV ASCU

## 2012-03-06 ENCOUNTER — Telehealth: Payer: Self-pay

## 2012-03-06 ENCOUNTER — Telehealth: Payer: Self-pay | Admitting: Obstetrics and Gynecology

## 2012-03-06 DIAGNOSIS — E559 Vitamin D deficiency, unspecified: Secondary | ICD-10-CM

## 2012-03-06 NOTE — Telephone Encounter (Signed)
Pt was re-called regarding Vit-D level. Pt did not get previous message, stated that her cell phone was damaged and just gotten a new one. Pt was given Vit-D protocol. Rx was called into [facility] for Vit-D Softjels 50,000 units 1 capsule 1x wk for 12 wks #20 0rf. Future lab ordered. Mathis Bud

## 2012-03-06 NOTE — Telephone Encounter (Signed)
Done, [name]

## 2012-03-06 NOTE — Telephone Encounter (Signed)
Jackie/epic °

## 2012-03-20 ENCOUNTER — Telehealth: Payer: Self-pay | Admitting: Obstetrics and Gynecology

## 2012-03-20 NOTE — Telephone Encounter (Signed)
Pam is following up with pt

## 2012-03-20 NOTE — Telephone Encounter (Signed)
Pam/pt return call

## 2012-03-21 ENCOUNTER — Telehealth: Payer: Self-pay

## 2012-03-21 NOTE — Telephone Encounter (Signed)
Pt was called regarding a message that we called her and she didn't know why. Pt stated that she was confused and that  our office had called her regarding an outside appt for a mammogram. [name]

## 2012-03-26 ENCOUNTER — Other Ambulatory Visit: Payer: Self-pay | Admitting: Obstetrics and Gynecology

## 2012-03-26 ENCOUNTER — Ambulatory Visit: Admission: RE | Admit: 2012-03-26 | Payer: 59 | Source: Ambulatory Visit

## 2012-03-26 ENCOUNTER — Ambulatory Visit
Admission: RE | Admit: 2012-03-26 | Discharge: 2012-03-26 | Disposition: A | Discharge status: Home / Self Care | Payer: 59 | Source: Ambulatory Visit | Attending: Obstetrics and Gynecology | Admitting: Obstetrics and Gynecology

## 2012-03-26 DIAGNOSIS — Z139 Encounter for screening, unspecified: Secondary | ICD-10-CM

## 2012-03-29 ENCOUNTER — Other Ambulatory Visit: Payer: Self-pay | Admitting: Obstetrics and Gynecology

## 2012-03-29 DIAGNOSIS — R928 Other abnormal and inconclusive findings on diagnostic imaging of breast: Secondary | ICD-10-CM

## 2012-04-02 ENCOUNTER — Ambulatory Visit: Payer: 59

## 2012-04-03 ENCOUNTER — Telehealth: Payer: Self-pay | Admitting: Obstetrics and Gynecology

## 2012-04-03 NOTE — Telephone Encounter (Signed)
Ar pt 

## 2012-04-03 NOTE — Telephone Encounter (Signed)
[name]/[name] pt/pt has appt tomorrow.

## 2012-04-03 NOTE — Telephone Encounter (Signed)
LM for pt regarding clarification re: Vit D RX.  She needs to have level rechecked the 1st week in Sept. 2013. Recall will notify her when its time to schedule. Melody Comas A

## 2012-04-04 ENCOUNTER — Ambulatory Visit
Admission: RE | Admit: 2012-04-04 | Discharge: 2012-04-04 | Disposition: A | Discharge status: Home / Self Care | Payer: 59 | Source: Ambulatory Visit | Attending: Obstetrics and Gynecology | Admitting: Obstetrics and Gynecology

## 2012-04-04 DIAGNOSIS — R928 Other abnormal and inconclusive findings on diagnostic imaging of breast: Secondary | ICD-10-CM

## 2012-04-26 ENCOUNTER — Telehealth: Payer: Self-pay

## 2012-04-26 NOTE — Telephone Encounter (Signed)
Left message for pt to call regarding further studies following mammogram. [name]

## 2012-05-01 ENCOUNTER — Telehealth: Payer: Self-pay

## 2012-05-01 NOTE — Telephone Encounter (Signed)
Pt stated that she did have further studies done per results of previous mammogram and does not have to follow-up again until 6 months. Mathis Bud

## 2012-05-01 NOTE — Telephone Encounter (Signed)
Pt did call back and left a voice mail message with # to call. Pt was called back, left message on voicemail regarding question if she has followed up on mammogram with further studies. Mathis Bud

## 2012-08-13 ENCOUNTER — Other Ambulatory Visit: Payer: Self-pay | Admitting: Obstetrics and Gynecology

## 2012-08-14 ENCOUNTER — Other Ambulatory Visit: Payer: Self-pay | Admitting: Obstetrics and Gynecology

## 2012-08-28 ENCOUNTER — Other Ambulatory Visit: Payer: Self-pay | Admitting: Obstetrics and Gynecology

## 2012-08-28 DIAGNOSIS — E559 Vitamin D deficiency, unspecified: Secondary | ICD-10-CM

## 2012-08-28 NOTE — Telephone Encounter (Signed)
Pt states that she is needing rx refill of Vitamin D. Advised pt per JO to come in for lab apt to have level checked. And then a decision would be made regarding continuing Vitamin D based on level.  Apt scheduled for 08/29/2012 Pt voiced understanding [name], Herbert Seta

## 2012-08-29 ENCOUNTER — Other Ambulatory Visit: Payer: 59

## 2012-08-29 DIAGNOSIS — E559 Vitamin D deficiency, unspecified: Secondary | ICD-10-CM

## 2012-08-30 LAB — VITAMIN D 25 HYDROXY (VIT D DEFICIENCY, FRACTURES): Vit D, 25-Hydroxy: 31 ng/mL (ref 30–89)

## 2012-09-05 ENCOUNTER — Other Ambulatory Visit: Payer: Self-pay | Admitting: Obstetrics and Gynecology

## 2012-09-05 DIAGNOSIS — N63 Unspecified lump in unspecified breast: Secondary | ICD-10-CM

## 2012-09-05 DIAGNOSIS — N6009 Solitary cyst of unspecified breast: Secondary | ICD-10-CM

## 2012-09-09 ENCOUNTER — Emergency Department (HOSPITAL_COMMUNITY)
Admission: EM | Admit: 2012-09-09 | Discharge: 2012-09-09 | Disposition: A | Discharge status: Home / Self Care | Payer: 59 | Attending: Emergency Medicine | Admitting: Emergency Medicine

## 2012-09-09 ENCOUNTER — Encounter (HOSPITAL_COMMUNITY): Payer: Self-pay | Admitting: Family Medicine

## 2012-09-09 DIAGNOSIS — Z8679 Personal history of other diseases of the circulatory system: Secondary | ICD-10-CM | POA: Insufficient documentation

## 2012-09-09 DIAGNOSIS — R6884 Jaw pain: Secondary | ICD-10-CM | POA: Insufficient documentation

## 2012-09-09 DIAGNOSIS — Z87448 Personal history of other diseases of urinary system: Secondary | ICD-10-CM | POA: Insufficient documentation

## 2012-09-09 DIAGNOSIS — F3289 Other specified depressive episodes: Secondary | ICD-10-CM | POA: Insufficient documentation

## 2012-09-09 DIAGNOSIS — Z79899 Other long term (current) drug therapy: Secondary | ICD-10-CM | POA: Insufficient documentation

## 2012-09-09 DIAGNOSIS — Z8719 Personal history of other diseases of the digestive system: Secondary | ICD-10-CM | POA: Insufficient documentation

## 2012-09-09 DIAGNOSIS — F329 Major depressive disorder, single episode, unspecified: Secondary | ICD-10-CM | POA: Insufficient documentation

## 2012-09-09 DIAGNOSIS — F411 Generalized anxiety disorder: Secondary | ICD-10-CM | POA: Insufficient documentation

## 2012-09-09 DIAGNOSIS — Z8619 Personal history of other infectious and parasitic diseases: Secondary | ICD-10-CM | POA: Insufficient documentation

## 2012-09-09 DIAGNOSIS — R42 Dizziness and giddiness: Secondary | ICD-10-CM | POA: Insufficient documentation

## 2012-09-09 DIAGNOSIS — R110 Nausea: Secondary | ICD-10-CM | POA: Insufficient documentation

## 2012-09-09 DIAGNOSIS — Z87891 Personal history of nicotine dependence: Secondary | ICD-10-CM | POA: Insufficient documentation

## 2012-09-09 LAB — URINALYSIS, ROUTINE W REFLEX MICROSCOPIC
Bilirubin Urine: NEGATIVE
Hgb urine dipstick: NEGATIVE
Protein, ur: NEGATIVE mg/dL
Urobilinogen, UA: 0.2 mg/dL (ref 0.0–1.0)

## 2012-09-09 LAB — CBC WITH DIFFERENTIAL/PLATELET
Eosinophils Absolute: 0.2 10*3/uL (ref 0.0–0.7)
Hemoglobin: 14.1 g/dL (ref 12.0–15.0)
Lymphs Abs: 1.6 10*3/uL (ref 0.7–4.0)
MCH: 30.9 pg (ref 26.0–34.0)
Monocytes Relative: 10 % (ref 3–12)
Neutro Abs: 3.5 10*3/uL (ref 1.7–7.7)
Neutrophils Relative %: 60 % (ref 43–77)
RBC: 4.57 MIL/uL (ref 3.87–5.11)

## 2012-09-09 LAB — COMPREHENSIVE METABOLIC PANEL
Alkaline Phosphatase: 40 U/L (ref 39–117)
BUN: 12 mg/dL (ref 6–23)
CO2: 28 mEq/L (ref 19–32)
Chloride: 104 mEq/L (ref 96–112)
GFR calc Af Amer: >90 mL/min (ref >90)
GFR calc non Af Amer: >90 mL/min (ref >90)
Glucose, Bld: 115 mg/dL — ABNORMAL HIGH (ref 70–99)
Potassium: 4.2 mEq/L (ref 3.5–5.1)
Total Bilirubin: 0.3 mg/dL (ref 0.3–1.2)

## 2012-09-09 LAB — POCT I-STAT TROPONIN I: Troponin i, poc: 0 ng/mL (ref 0.00–0.08)

## 2012-09-09 MED ORDER — MECLIZINE HCL 25 MG PO TABS
50.0000 mg | ORAL_TABLET | Freq: Once | ORAL | Status: AC
  Administered 2012-09-09: 50 mg via ORAL
  Filled 2012-09-09: qty 2

## 2012-09-09 MED ORDER — MECLIZINE HCL 50 MG PO TABS: 50.0000 mg | ORAL_TABLET | Freq: Three times a day (TID) | ORAL | Status: DC | PRN

## 2012-09-09 NOTE — ED Notes (Signed)
Per pt she hasnt felt well all morning. sts having dizziness, nausea, chest pain and jaw pain. sts she just doesn't fell good and feel like she is going to pass out.

## 2012-09-09 NOTE — ED Provider Notes (Addendum)
History     CSN: [id number]  Arrival date & time 09/09/12  1142   First MD Initiated Contact with Patient 09/09/12 1505      Chief Complaint  Patient presents with  . Dizziness  . Jaw Pain    (Consider location/radiation/quality/duration/timing/severity/associated sxs/prior treatment) HPI Comments: Pt comes in with cc of vertigo. Pt states that this morning, she just wasn't feeling well, and when she went to get the Chritmas tree, started feeling dizzy. Dizziness is described as spinning - and it is worse with positions, and when she is in the car, or was on the wheel chair earlier today. She has no associated neuro complains, including, no headaches. She does have some associated nausea. No new meds, and no hx of same complain in the past. No chest pain, sob, palpitations. No hx of PE, DVT.  The history is provided by the patient.    Past Medical History  Diagnosis Date  . Anxiety   . H/O candidiasis   . IBS (irritable bowel syndrome)   . H/O cystitis     1-2 x a year  . H/O pyelonephritis   . Headache     migraines   . Depression   . AMA (advanced maternal age) multigravida 35+     Past Surgical History  Procedure Date  . Left knee surgery   . Wisdom tooth extraction   . Cesarean section     Family History  Problem Relation Age of Onset  . Depression Sister   . Diabetes Brother   . Depression Brother   . Heart disease Maternal Aunt     bypass surgery  . Cancer Maternal Aunt     intestinal     History  Substance Use Topics  . Smoking status: Former Smoker    Types: Cigarettes  . Smokeless tobacco: Former Neurosurgeon    Quit date: 10/04/2011  . Alcohol Use: Yes    OB History    Grav Para Term Preterm Abortions TAB SAB Ect Mult Living   4 3 3  1 1    3       Review of Systems  Constitutional: Negative for activity change.  HENT: Negative for ear pain, facial swelling, neck pain and tinnitus.   Respiratory: Negative for cough, shortness of breath and  wheezing.   Cardiovascular: Negative for chest pain.  Gastrointestinal: Positive for nausea. Negative for vomiting, abdominal pain, diarrhea, constipation, blood in stool and abdominal distention.  Genitourinary: Negative for hematuria and difficulty urinating.  Musculoskeletal: Negative for gait problem.  Skin: Negative for color change.  Neurological: Positive for dizziness. Negative for syncope and speech difficulty.  Hematological: Does not bruise/bleed easily.  Psychiatric/Behavioral: Negative for confusion.    Allergies  Hydrocodone  Home Medications   Current Outpatient Rx  Name  Route  Sig  Dispense  Refill  . ALPRAZOLAM 0.5 MG PO TABS   Oral   Take 0.5 mg by mouth 3 (three) times daily as needed. For anxiety           BP 113/68  Pulse 72  Temp 97.9 F (36.6 C)  Resp 18  SpO2 100%  LMP 03/22/2012  Physical Exam  Constitutional: She is oriented to person, place, and time. She appears well-developed.  HENT:  Head: Normocephalic and atraumatic.       Pt has some saccadic eye movement bilaterally with the gaze.   Eyes: Conjunctivae normal and EOM are normal. Pupils are equal, round, and reactive to light.  Neck:  Normal range of motion. Neck supple. No JVD present.  Cardiovascular: Normal rate, regular rhythm and normal heart sounds.   Pulmonary/Chest: Effort normal and breath sounds normal. No respiratory distress.  Abdominal: Soft. Bowel sounds are normal. She exhibits no distension. There is no tenderness. There is no rebound and no guarding.  Musculoskeletal:       Cerebellar exam is normal (finger to nose) Sensory exam normal for bilateral upper and lower extremities - and patient is able to discriminate between sharp and dull. Motor exam is 4+/5  Gilberto Better did reproduce her symptoms and so did the lateral gaze.  Neurological: She is alert and oriented to person, place, and time.  Skin: Skin is warm and dry.    ED Course  Procedures (including  critical care time)  Labs Reviewed  GLUCOSE, CAPILLARY - Abnormal; Notable for the following:    Glucose-Capillary 101 (*)     All other components within normal limits  COMPREHENSIVE METABOLIC PANEL - Abnormal; Notable for the following:    Glucose, Bld 115 (*)     All other components within normal limits  CBC WITH DIFFERENTIAL  POCT I-STAT TROPONIN I  URINALYSIS, ROUTINE W REFLEX MICROSCOPIC  TROPONIN I   No results found.   No diagnosis found.    MDM  DDx includes: Central vertigo:  Tumor  Stroke  ICH  Vertebrobasilar TIA  Peripheral Vertigo:  BPPV  Vestibular neuritis  Meniere disease  Migrainous vertigo  Ear Infection    Pt comes in with cc of vertigo. Her vertigo is mild, but reproducible and has associated nausea. Initial impression is that she has a peripheral process, but the vertigo is a little undifferentiated. Neuro exam is normal, she has no risk factors for stroke. No hx of similar sx or any neuro complains in the past, as we also entertained MS with her slight saccadic eye moment.  Plan is to get orthostatics, basic labs. Stress echo last yr was normal - so trops x 2 sufficient i believe for these atypical sx. We will give meclizine and hydrate as well.    4:51 PM Pt's dizziness/vertigo has resolved, and even her nausea has subsided. I have seen her ambulate, and there is no gait instability.. She has been advised to see her Primary care doctor for further evaluation, and we ill give her Neurology contact as well for the vertigo.   Derwood Kaplan, MD 09/09/12 1704   Date: 09/09/2012  Rate: 73  Rhythm: normal sinus rhythm  QRS Axis: normal  Intervals: normal  ST/T Wave abnormalities: normal  Conduction Disutrbances: none  Narrative Interpretation: unremarkable      Derwood Kaplan, MD 09/09/12 1706

## 2012-09-09 NOTE — ED Notes (Signed)
Pt complaining of intermittent dizziness as well as pain in her neck and back, denies radiation of pain.  VSS, pt in NAD at this time.

## 2012-10-08 ENCOUNTER — Ambulatory Visit
Admission: RE | Admit: 2012-10-08 | Discharge: 2012-10-08 | Disposition: A | Discharge status: Home / Self Care | Payer: 59 | Source: Ambulatory Visit | Attending: Obstetrics and Gynecology | Admitting: Obstetrics and Gynecology

## 2012-10-08 DIAGNOSIS — N63 Unspecified lump in unspecified breast: Secondary | ICD-10-CM

## 2012-12-24 ENCOUNTER — Encounter (INDEPENDENT_AMBULATORY_CARE_PROVIDER_SITE_OTHER): Payer: Self-pay

## 2012-12-28 ENCOUNTER — Ambulatory Visit (INDEPENDENT_AMBULATORY_CARE_PROVIDER_SITE_OTHER): Payer: Self-pay | Admitting: General Surgery

## 2012-12-28 ENCOUNTER — Encounter (INDEPENDENT_AMBULATORY_CARE_PROVIDER_SITE_OTHER): Payer: Self-pay | Admitting: General Surgery

## 2012-12-28 VITALS — BP 121/70 | HR 66 | Temp 98.3°F | Resp 16 | Ht 67.5 in | Wt 181.6 lb

## 2012-12-28 DIAGNOSIS — E041 Nontoxic single thyroid nodule: Secondary | ICD-10-CM | POA: Insufficient documentation

## 2012-12-28 NOTE — Patient Instructions (Signed)
Plan for fine needle aspiration of left thryoid nodule

## 2013-01-01 ENCOUNTER — Other Ambulatory Visit (HOSPITAL_COMMUNITY)
Admission: RE | Admit: 2013-01-01 | Discharge: 2013-01-01 | Disposition: A | Discharge status: Home / Self Care | Payer: 59 | Source: Ambulatory Visit | Attending: Interventional Radiology | Admitting: Interventional Radiology

## 2013-01-01 ENCOUNTER — Ambulatory Visit
Admission: RE | Admit: 2013-01-01 | Discharge: 2013-01-01 | Disposition: A | Discharge status: Home / Self Care | Payer: 59 | Source: Ambulatory Visit | Attending: General Surgery | Admitting: General Surgery

## 2013-01-01 DIAGNOSIS — E041 Nontoxic single thyroid nodule: Secondary | ICD-10-CM

## 2013-01-08 ENCOUNTER — Encounter (INDEPENDENT_AMBULATORY_CARE_PROVIDER_SITE_OTHER): Payer: Self-pay | Admitting: General Surgery

## 2013-01-08 NOTE — Progress Notes (Signed)
Subjective:     Patient ID: [name], female   DOB: 01/22/1966, 47 y.o.   MRN: [id number]  HPI We are asked to see the patient in consultation by Dr. Collins Scotland to evaluate her for a thyroid nodule. The patient is a 47 year old white female who over the last few months has been feeling tired. She has noted that she has gained some weight. She also reports that she has a dry throat but does not have problems swallowing solids or liquids. She has had some anxiety attacks in the past as well. She does have a son with special needs and some of this she feels like maybe related to the care of her son. Her TSH was normal. Her thyroid ultrasound showed a dominant nodule in the left lobe measuring 3.4 cm in maximal dimension.  Review of Systems  Constitutional: Positive for fatigue.  HENT: Negative.  Negative for trouble swallowing.   Eyes: Negative.   Respiratory: Negative.   Cardiovascular: Negative.   Gastrointestinal: Negative.   Endocrine: Negative.   Genitourinary: Negative.   Musculoskeletal: Negative.   Skin: Negative.   Allergic/Immunologic: Negative.   Neurological: Negative.   Hematological: Negative.   Psychiatric/Behavioral: Negative.        Objective:   Physical Exam  Constitutional: She is oriented to person, place, and time. She appears well-developed and well-nourished.  HENT:  Head: Normocephalic and atraumatic.  Eyes: Conjunctivae and EOM are normal. Pupils are equal, round, and reactive to light.  Neck: Normal range of motion. Neck supple.  There is a palpable nodule in the left lobe of the thyroid gland that feels approximately 2 cm in diameter. It is mobile. There is no palpable lymphadenopathy.  Cardiovascular: Normal rate, regular rhythm and normal heart sounds.   Pulmonary/Chest: Effort normal and breath sounds normal.  Abdominal: Soft. Bowel sounds are normal.  Musculoskeletal: Normal range of motion.  Neurological: She is alert and oriented to person, place, and  time.  Skin: Skin is warm and dry.  Psychiatric: She has a normal mood and affect. Her behavior is normal.       Assessment:     The patient has a palpable nodule in the left lobe of the thyroid gland. She appears to have normal thyroid function studies. I think the next step to evaluate this would be a fine-needle aspiration of the nodule.     Plan:     Plan for fine needle aspiration of left thyroid nodule

## 2013-01-14 ENCOUNTER — Encounter (INDEPENDENT_AMBULATORY_CARE_PROVIDER_SITE_OTHER): Payer: 59 | Admitting: General Surgery

## 2013-01-29 ENCOUNTER — Ambulatory Visit (INDEPENDENT_AMBULATORY_CARE_PROVIDER_SITE_OTHER): Payer: 59 | Admitting: General Surgery

## 2013-01-29 ENCOUNTER — Encounter (INDEPENDENT_AMBULATORY_CARE_PROVIDER_SITE_OTHER): Payer: Self-pay | Admitting: General Surgery

## 2013-01-29 VITALS — BP 128/72 | HR 76 | Temp 97.1°F | Resp 16 | Ht 67.0 in | Wt 183.0 lb

## 2013-01-29 DIAGNOSIS — E041 Nontoxic single thyroid nodule: Secondary | ICD-10-CM

## 2013-01-29 NOTE — Patient Instructions (Addendum)
Call if you decide to schedule surgery Will repeat u/s in 6 months

## 2013-01-29 NOTE — Progress Notes (Signed)
Subjective:     Patient ID: [name], female   DOB: June 04, 1966, 47 y.o.   MRN: [id number]  HPI The patient is a 47 year old white female who we have seen recently for a nodule in her left thyroid lobe. Since her last visit we had this biopsied by FNA in the FNA was benign. Her thyroid function studies are normal. Since her last visit she feels as though she can feel the nodule female and at times feels like it is causing some compression of her airway. She denies any shortness of breath though. She has no problems swallowing solids or liquids. She denies any hoarseness.  Review of Systems  Constitutional: Negative.   HENT: Negative.   Eyes: Negative.   Respiratory: Negative.   Cardiovascular: Negative.   Gastrointestinal: Negative.   Endocrine: Negative.   Genitourinary: Negative.   Musculoskeletal: Negative.   Skin: Negative.   Allergic/Immunologic: Negative.   Neurological: Negative.   Hematological: Negative.   Psychiatric/Behavioral: The patient is nervous/anxious.        Objective:   Physical Exam  Constitutional: She is oriented to person, place, and time. She appears well-developed and well-nourished.  HENT:  Head: Normocephalic and atraumatic.  Eyes: Conjunctivae and EOM are normal. Pupils are equal, round, and reactive to light.  Neck: Normal range of motion. Neck supple. No tracheal deviation present. Thyromegaly present.  Cardiovascular: Normal rate, regular rhythm and normal heart sounds.   Pulmonary/Chest: Effort normal and breath sounds normal.  Abdominal: Soft. Bowel sounds are normal.  Musculoskeletal: Normal range of motion.  Lymphadenopathy:    She has no cervical adenopathy.  Neurological: She is alert and oriented to person, place, and time.  Skin: Skin is warm and dry.  Psychiatric: She has a normal mood and affect. Her behavior is normal.       Assessment:     The patient has what appears to be a 3.4 cm nodule in the left lobe of her thyroid  gland. The FNA was benign. Because of this one could make an argument for monitoring the lesion. It also seems as though she is starting to develop some compressive symptoms which I think would be a good indication to remove at least the left lobe of the thyroid gland. I have talked to her in detail about this including the risks and benefits of surgery as well as some of the technical aspects including the risk of injury to the recurrent laryngeal nerve and parathyroid glands at this point she would like to think about surgery and not schedule at this time     Plan:     At this point I will plan to see her back in 6 months with a repeat ultrasound of her neck. If she changes her mind in the meantime I would like surgery all she needs to do is call

## 2013-02-14 ENCOUNTER — Telehealth (INDEPENDENT_AMBULATORY_CARE_PROVIDER_SITE_OTHER): Payer: Self-pay | Admitting: *Deleted

## 2013-02-14 NOTE — Telephone Encounter (Signed)
One possibility would be to have her see an endocrinologist to see if they feel she would be a candidate for suppression therapy. We could see if this shrinks it at all.

## 2013-02-14 NOTE — Telephone Encounter (Signed)
Patient called to ask if there are any other alternatives to surgery that Dr. Carolynne Edouard would recommend.

## 2013-02-15 NOTE — Telephone Encounter (Signed)
LMOM to call back. Please relay Dr Carolynne Edouard message. If she would like referral to endocrinology I will put referral in computer.

## 2013-05-01 IMAGING — US US BREAST*L*
1 series · 13 of 16 positions shown · non-contrast
Comparison: Previous examinations, including the screening
mammogram dated 03/26/2012.

CLINICAL DATA: Possible left breast mass at recent screening
mammography.

DIGITAL DIAGNOSTIC LEFT MAMMOGRAM  AND LEFT BREAST ULTRASOUND:

[Series 1: us breast*left* · 13 of 16 slices shown]
[im 1/16]
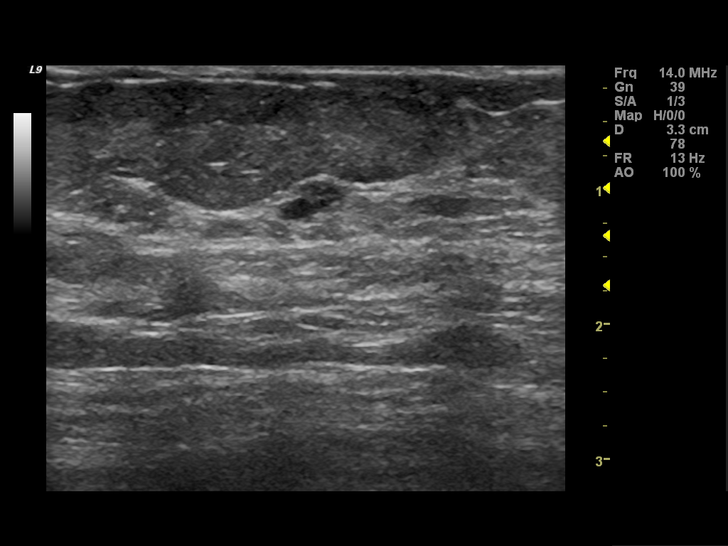
[im 2/16]
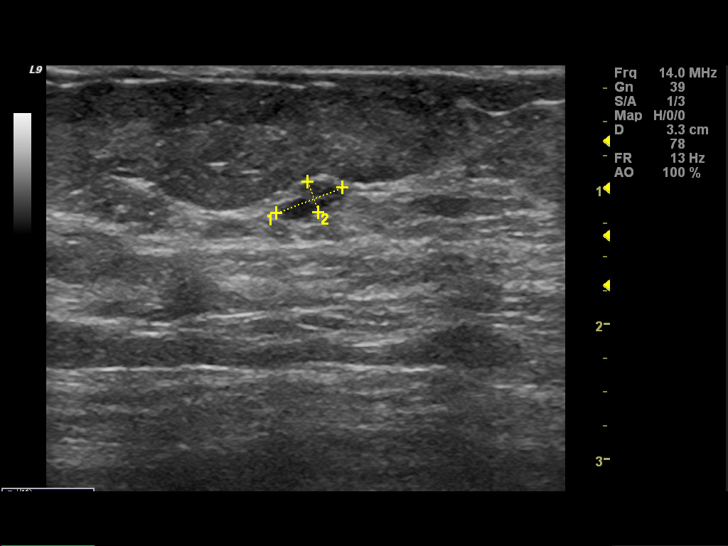
[im 4/16]
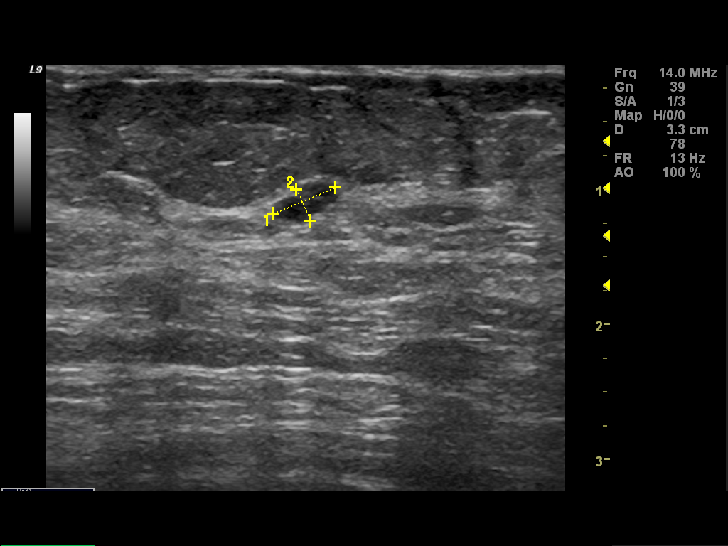
[im 5/16]
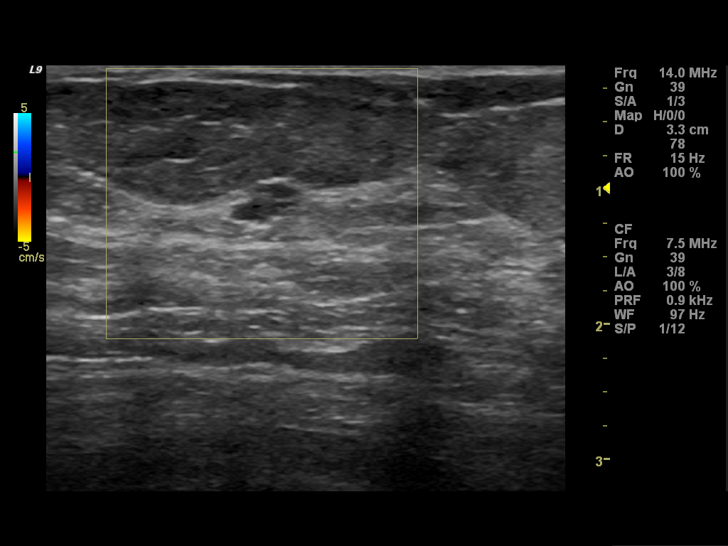
[im 6/16]
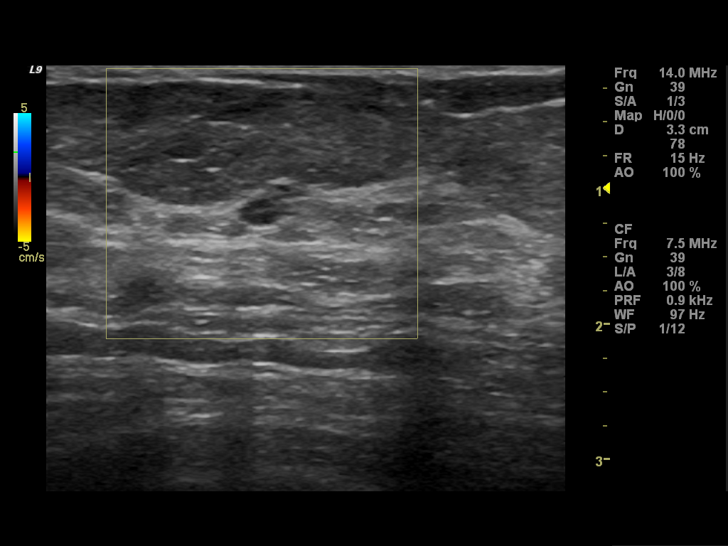
[im 7/16]
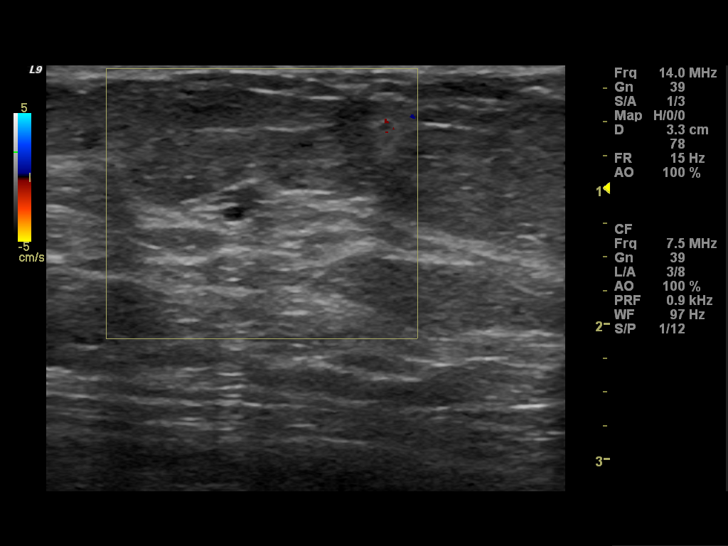
[im 9/16]
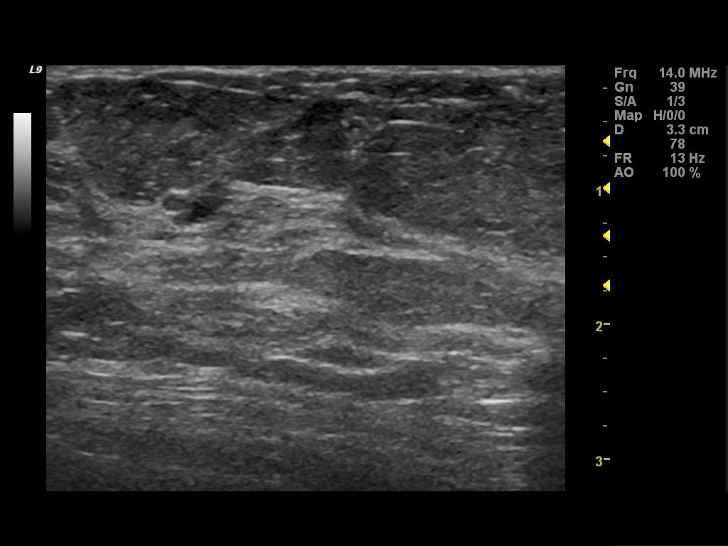
[im 10/16]
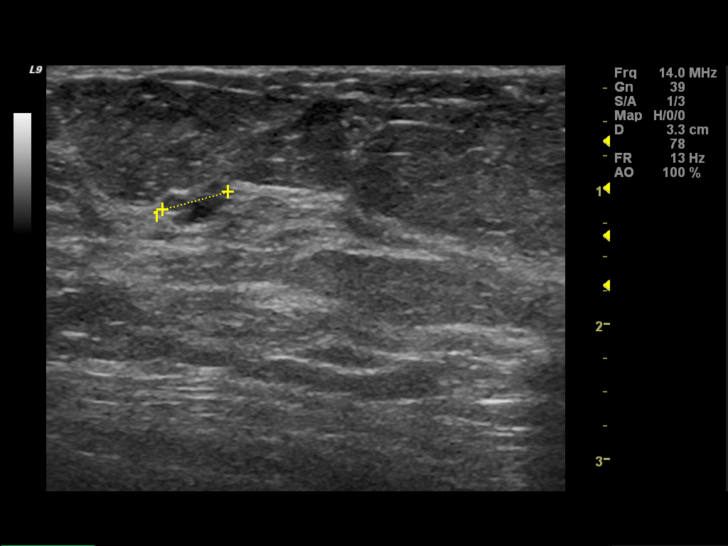
[im 11/16]
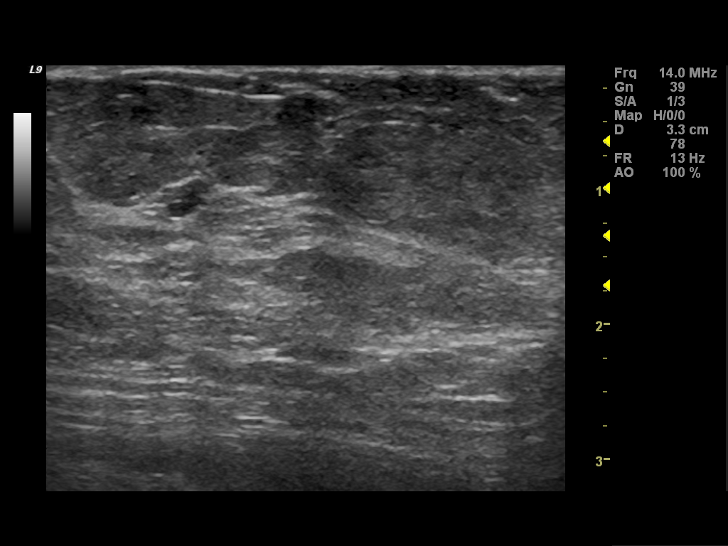
[im 12/16]
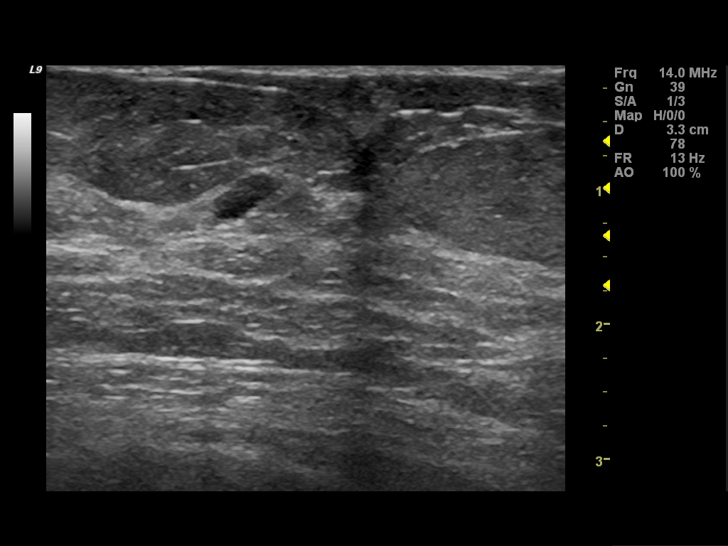
[im 13/16]
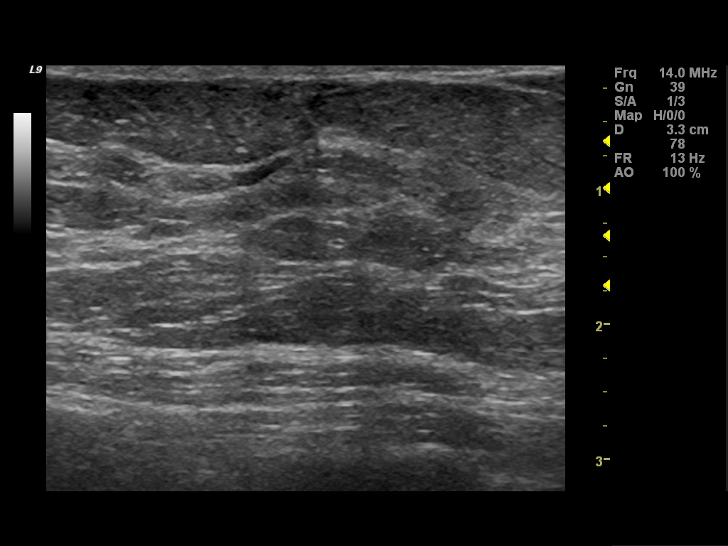
[im 15/16]
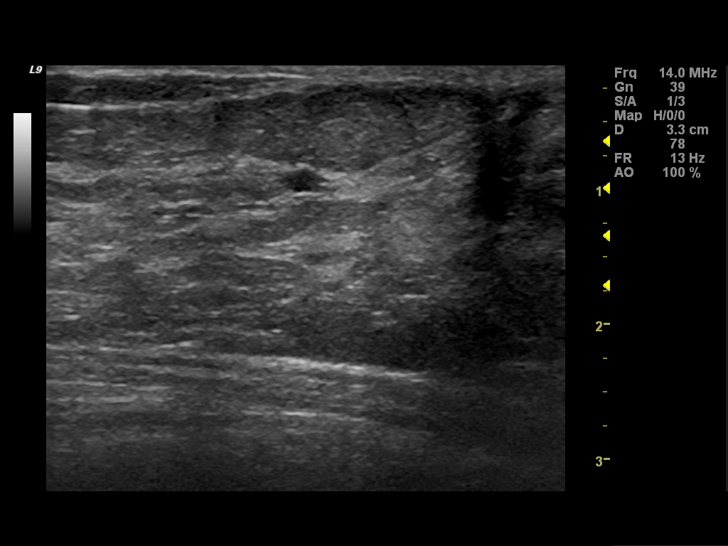
[im 16/16]
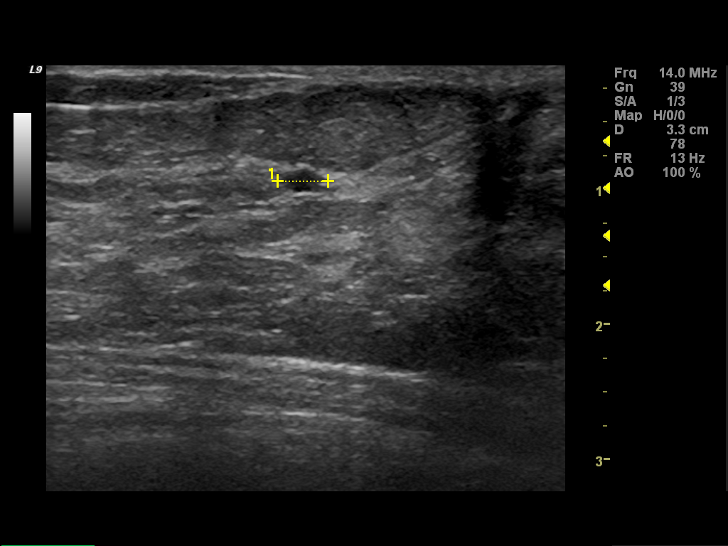

[13 of 16 positions shown; findings below may reference images not displayed]

FINDINGS: Spot compression views of the left breast confirm a
small, oval, partially obscured mass in the central portion of the
breast, slightly laterally.  The visualized margins are smooth.

On physical exam, no mass is palpable in the central left breast.

Ultrasound is performed, showing a 5 x 5 x 2 mm oval, horizontally
oriented, smoothly marginated, macrolobulated hypoechoic mass in
the 1 o'clock subareolar region of the left breast.  This
corresponds to the mammographic mass.  This contains some low level
internal echoes and a thin internal septation.

Also demonstrated is a 4 x 4 x 1 mm similar appearing mass in the
1:30 o'clock subareolar region of the left breast.  Neither of
these demonstrates increased or decreased through transmission of
sound.
IMPRESSION: Two small probable minimally complex left breast cysts, as
described above. A follow up left breast ultrasound is recommended
in six months to assess stability.  The option of ultrasound-guided
core needle biopsy was discussed with the patient but not
recommended.  She is agreeable to a six-month follow-up ultrasound.

RECOMMENDATION:
Left breast ultrasound in 6 months.

BI-RADS CATEGORY 3:  Probably benign finding(s) - short interval
follow-up suggested.

## 2013-05-01 IMAGING — MG MM DIAGNOSTIC LTD LEFT
3 series · 3 of 3 positions shown · non-contrast
Comparison: Previous examinations, including the screening
mammogram dated 03/26/2012.

CLINICAL DATA: Possible left breast mass at recent screening
mammography.

DIGITAL DIAGNOSTIC LEFT MAMMOGRAM  AND LEFT BREAST ULTRASOUND:

[L CC (1 of 2)]
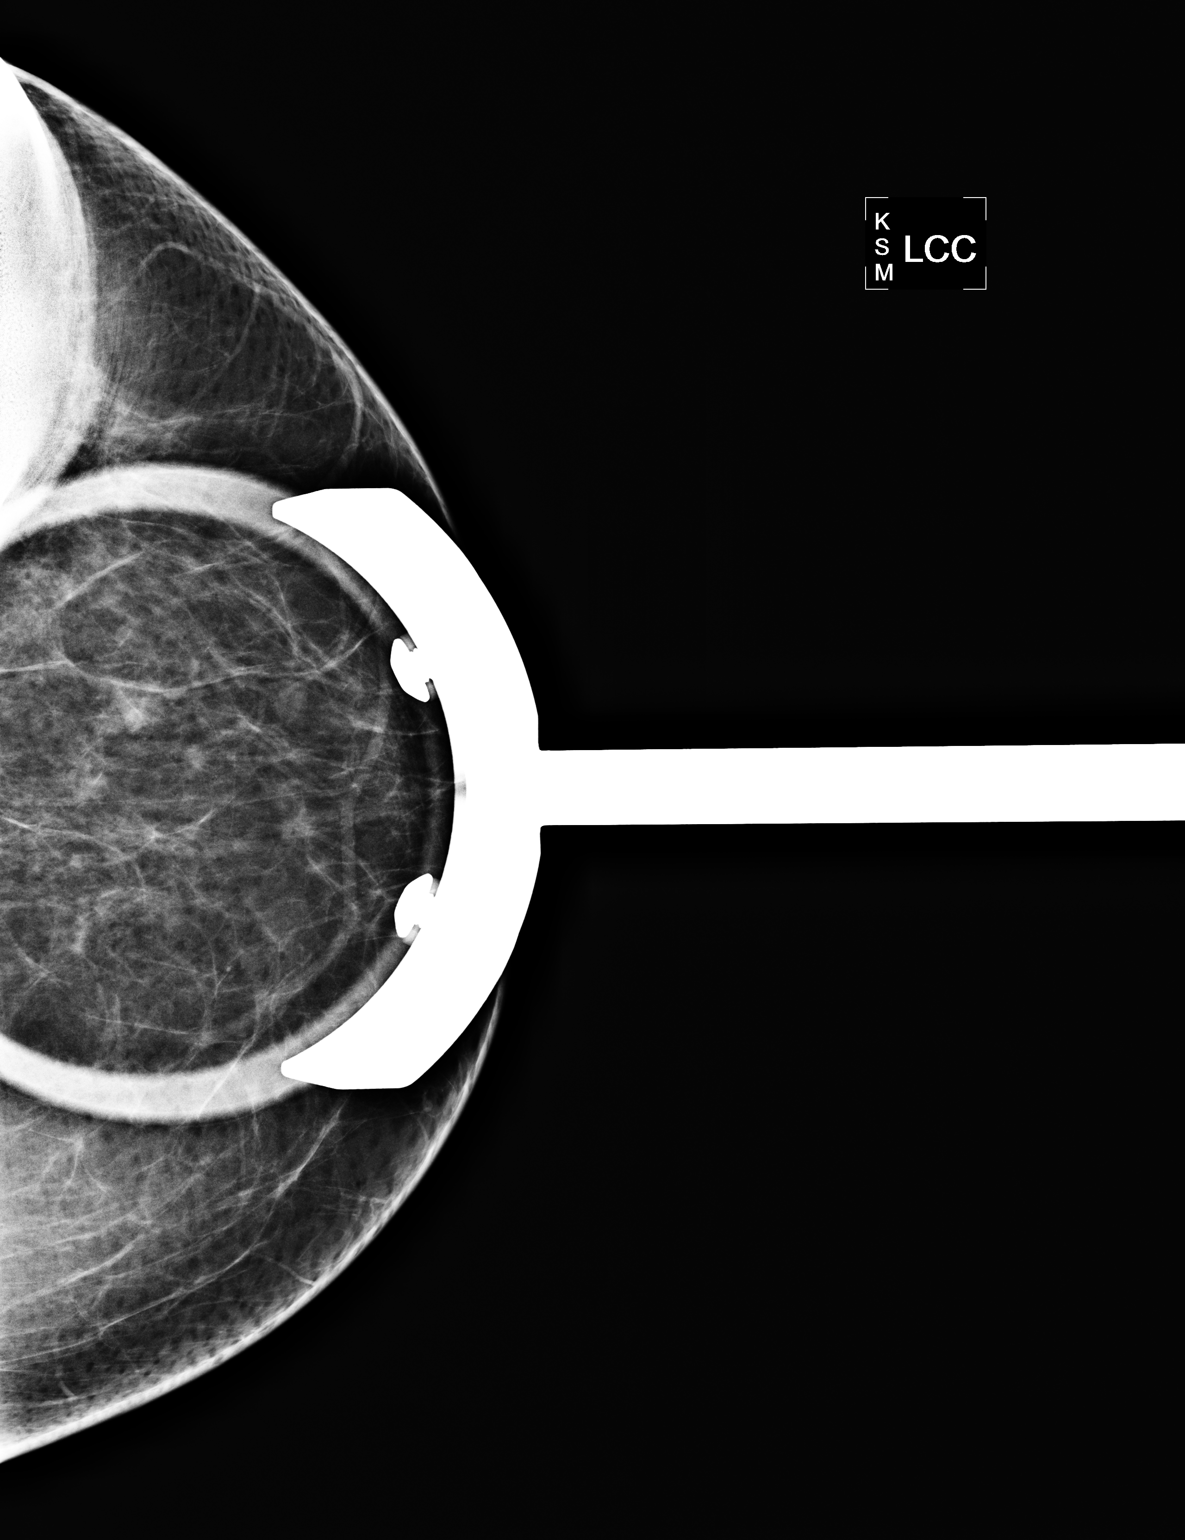

[L MLO]
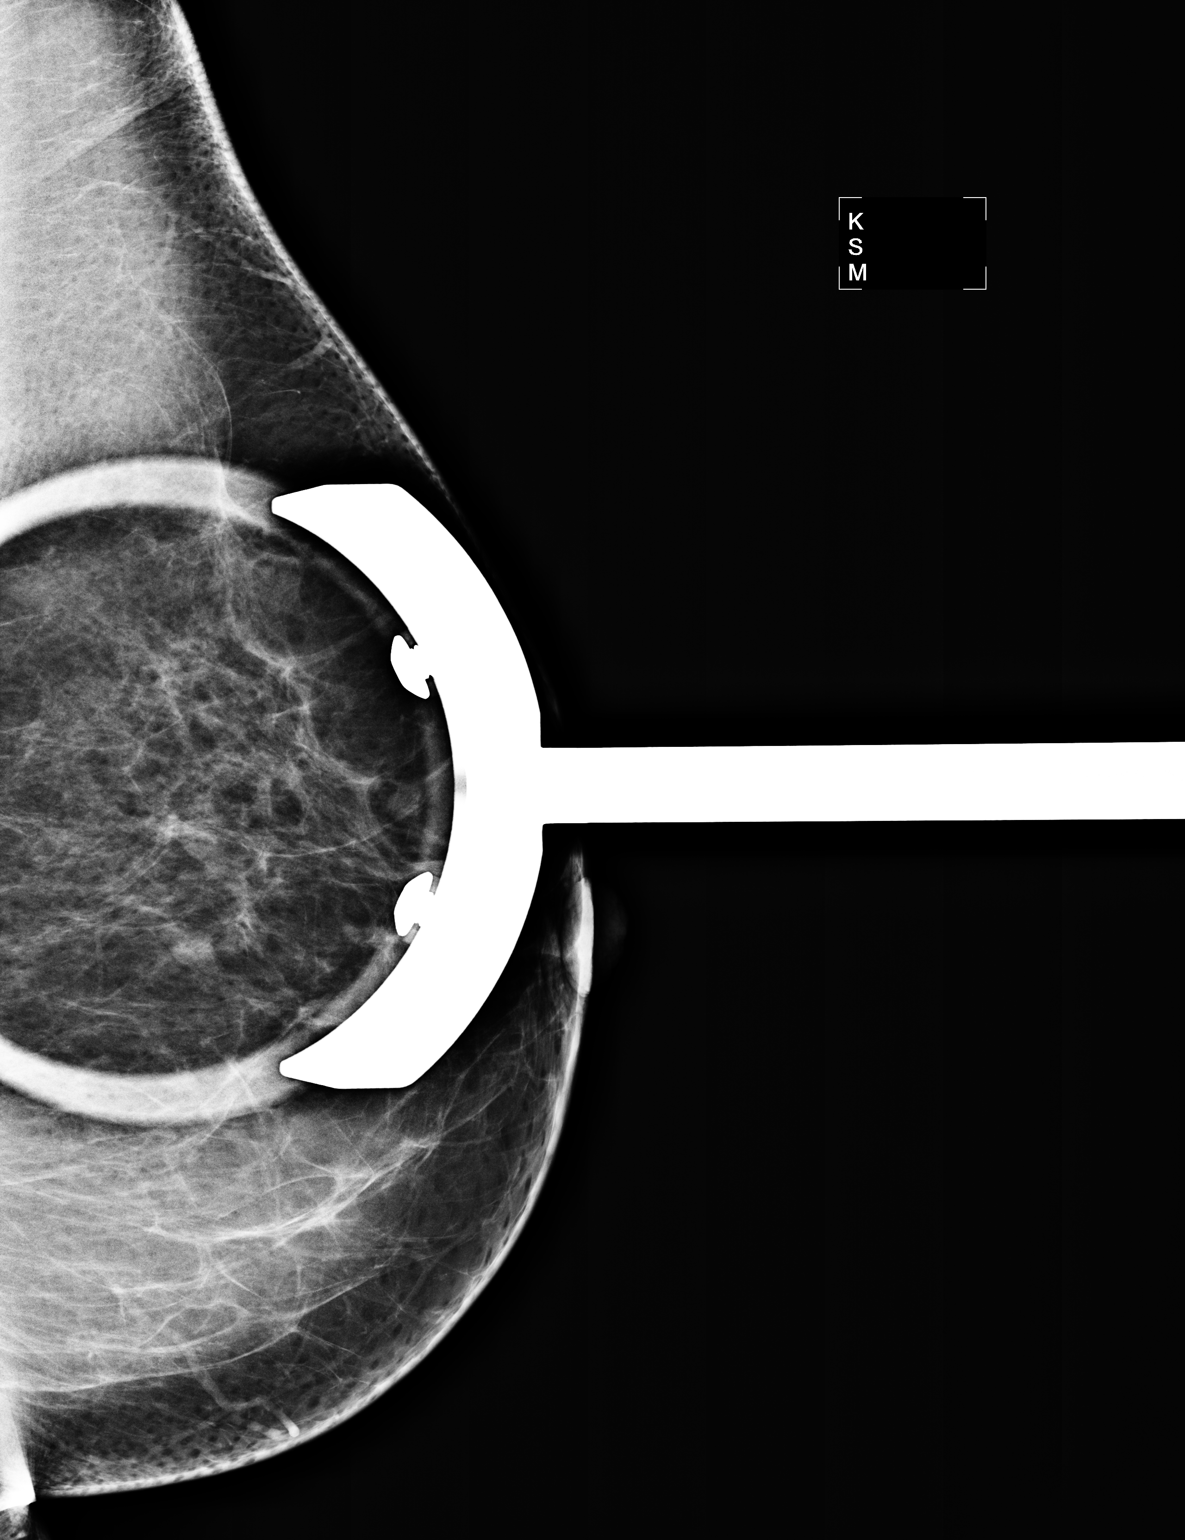

[L CC (2 of 2)]
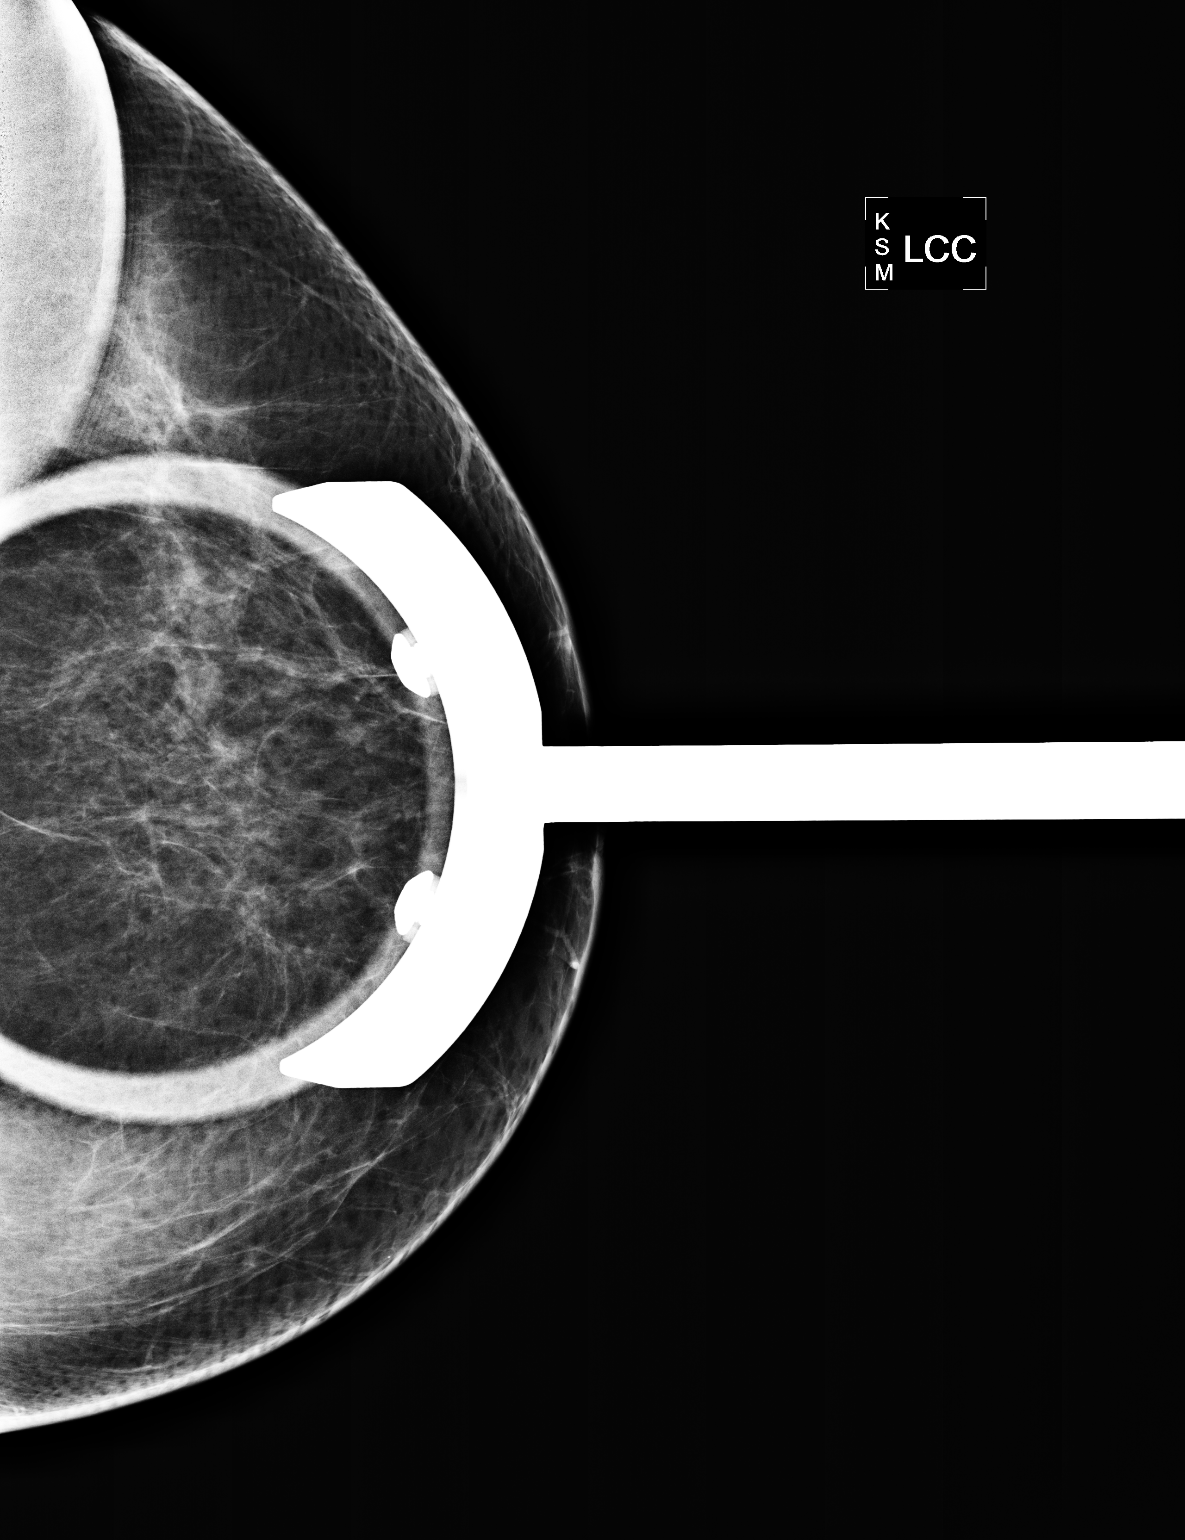

[3 of 3 positions shown; findings below may reference images not displayed]

FINDINGS: Spot compression views of the left breast confirm a
small, oval, partially obscured mass in the central portion of the
breast, slightly laterally.  The visualized margins are smooth.

On physical exam, no mass is palpable in the central left breast.

Ultrasound is performed, showing a 5 x 5 x 2 mm oval, horizontally
oriented, smoothly marginated, macrolobulated hypoechoic mass in
the 1 o'clock subareolar region of the left breast.  This
corresponds to the mammographic mass.  This contains some low level
internal echoes and a thin internal septation.

Also demonstrated is a 4 x 4 x 1 mm similar appearing mass in the
1:30 o'clock subareolar region of the left breast.  Neither of
these demonstrates increased or decreased through transmission of
sound.
IMPRESSION: Two small probable minimally complex left breast cysts, as
described above. A follow up left breast ultrasound is recommended
in six months to assess stability.  The option of ultrasound-guided
core needle biopsy was discussed with the patient but not
recommended.  She is agreeable to a six-month follow-up ultrasound.

RECOMMENDATION:
Left breast ultrasound in 6 months.

BI-RADS CATEGORY 3:  Probably benign finding(s) - short interval
follow-up suggested.

## 2013-11-01 ENCOUNTER — Encounter (INDEPENDENT_AMBULATORY_CARE_PROVIDER_SITE_OTHER): Payer: 59 | Admitting: General Surgery

## 2013-12-06 ENCOUNTER — Other Ambulatory Visit: Payer: Self-pay | Admitting: Endocrinology

## 2013-12-06 DIAGNOSIS — E041 Nontoxic single thyroid nodule: Secondary | ICD-10-CM

## 2013-12-20 ENCOUNTER — Emergency Department (HOSPITAL_COMMUNITY): Payer: 59

## 2013-12-20 ENCOUNTER — Encounter (HOSPITAL_COMMUNITY): Payer: Self-pay | Admitting: Emergency Medicine

## 2013-12-20 ENCOUNTER — Emergency Department (HOSPITAL_COMMUNITY)
Admission: EM | Admit: 2013-12-20 | Discharge: 2013-12-20 | Discharge status: Against Medical Advice | Payer: 59 | Attending: Emergency Medicine | Admitting: Emergency Medicine

## 2013-12-20 DIAGNOSIS — R079 Chest pain, unspecified: Secondary | ICD-10-CM | POA: Insufficient documentation

## 2013-12-20 DIAGNOSIS — Z87891 Personal history of nicotine dependence: Secondary | ICD-10-CM | POA: Insufficient documentation

## 2013-12-20 NOTE — ED Notes (Signed)
Pt here with c/o right side chest pain , pt is also c/o right arm pain  Pt was seen by her MD today and was prescribed pain meds

## 2014-06-10 ENCOUNTER — Other Ambulatory Visit: Payer: 59

## 2014-08-04 ENCOUNTER — Encounter (HOSPITAL_COMMUNITY): Payer: Self-pay | Admitting: Emergency Medicine

## 2014-09-23 ENCOUNTER — Other Ambulatory Visit: Payer: Self-pay | Admitting: Internal Medicine

## 2014-09-23 DIAGNOSIS — E042 Nontoxic multinodular goiter: Secondary | ICD-10-CM

## 2014-09-29 ENCOUNTER — Ambulatory Visit
Admission: RE | Admit: 2014-09-29 | Discharge: 2014-09-29 | Disposition: A | Discharge status: Home / Self Care | Payer: 59 | Source: Ambulatory Visit | Attending: Internal Medicine | Admitting: Internal Medicine

## 2014-09-29 DIAGNOSIS — E042 Nontoxic multinodular goiter: Secondary | ICD-10-CM

## 2015-06-08 ENCOUNTER — Emergency Department (HOSPITAL_BASED_OUTPATIENT_CLINIC_OR_DEPARTMENT_OTHER): Payer: 59

## 2015-06-08 ENCOUNTER — Encounter (HOSPITAL_BASED_OUTPATIENT_CLINIC_OR_DEPARTMENT_OTHER): Payer: Self-pay

## 2015-06-08 ENCOUNTER — Emergency Department (HOSPITAL_BASED_OUTPATIENT_CLINIC_OR_DEPARTMENT_OTHER)
Admission: EM | Admit: 2015-06-08 | Discharge: 2015-06-08 | Disposition: A | Discharge status: Home / Self Care | Payer: 59 | Attending: Emergency Medicine | Admitting: Emergency Medicine

## 2015-06-08 DIAGNOSIS — Z8659 Personal history of other mental and behavioral disorders: Secondary | ICD-10-CM | POA: Insufficient documentation

## 2015-06-08 DIAGNOSIS — Z720 Tobacco use: Secondary | ICD-10-CM | POA: Insufficient documentation

## 2015-06-08 DIAGNOSIS — Z8619 Personal history of other infectious and parasitic diseases: Secondary | ICD-10-CM | POA: Diagnosis not present

## 2015-06-08 DIAGNOSIS — Z8719 Personal history of other diseases of the digestive system: Secondary | ICD-10-CM | POA: Insufficient documentation

## 2015-06-08 DIAGNOSIS — M549 Dorsalgia, unspecified: Secondary | ICD-10-CM | POA: Insufficient documentation

## 2015-06-08 DIAGNOSIS — Z87440 Personal history of urinary (tract) infections: Secondary | ICD-10-CM | POA: Diagnosis not present

## 2015-06-08 DIAGNOSIS — Z3202 Encounter for pregnancy test, result negative: Secondary | ICD-10-CM | POA: Diagnosis not present

## 2015-06-08 DIAGNOSIS — R109 Unspecified abdominal pain: Secondary | ICD-10-CM | POA: Diagnosis present

## 2015-06-08 DIAGNOSIS — Z87448 Personal history of other diseases of urinary system: Secondary | ICD-10-CM | POA: Insufficient documentation

## 2015-06-08 HISTORY — DX: Calculus of kidney: N20.0

## 2015-06-08 LAB — PREGNANCY, URINE: Preg Test, Ur: NEGATIVE

## 2015-06-08 LAB — URINALYSIS, ROUTINE W REFLEX MICROSCOPIC
Bilirubin Urine: NEGATIVE
Glucose, UA: NEGATIVE mg/dL
Hgb urine dipstick: NEGATIVE
KETONES UR: NEGATIVE mg/dL
Leukocytes, UA: NEGATIVE
NITRITE: NEGATIVE
PH: 6.5 (ref 5.0–8.0)
Protein, ur: NEGATIVE mg/dL
Specific Gravity, Urine: 1.008 (ref 1.005–1.030)
Urobilinogen, UA: 0.2 mg/dL (ref 0.0–1.0)

## 2015-06-08 MED ORDER — IBUPROFEN 800 MG PO TABS
800.0000 mg | ORAL_TABLET | Freq: Once | ORAL | Status: AC
  Administered 2015-06-08: 800 mg via ORAL
  Filled 2015-06-08: qty 1

## 2015-06-08 MED ORDER — MORPHINE SULFATE (PF) 2 MG/ML IV SOLN
1.0000 mg | INTRAVENOUS | Status: DC | PRN
  Filled 2015-06-08: qty 1

## 2015-06-08 MED ORDER — MORPHINE SULFATE (PF) 2 MG/ML IV SOLN
2.0000 mg | INTRAVENOUS | Status: DC | PRN
  Administered 2015-06-08: 2 mg via INTRAVENOUS

## 2015-06-08 MED ORDER — TRAMADOL HCL 50 MG PO TABS: 50.0000 mg | ORAL_TABLET | Freq: Four times a day (QID) | ORAL | Status: AC | PRN

## 2015-06-08 MED ORDER — ONDANSETRON HCL 4 MG/2ML IJ SOLN
4.0000 mg | Freq: Four times a day (QID) | INTRAMUSCULAR | Status: DC | PRN
  Administered 2015-06-08: 4 mg via INTRAVENOUS
  Filled 2015-06-08: qty 2

## 2015-06-08 MED ORDER — BACLOFEN 20 MG PO TABS: 20.0000 mg | ORAL_TABLET | Freq: Three times a day (TID) | ORAL | Status: AC

## 2015-06-08 NOTE — ED Notes (Signed)
MD at bedside. 

## 2015-06-08 NOTE — ED Notes (Signed)
Left flank and abd pain since yesterday-states feels like kidney stone pain

## 2015-06-08 NOTE — Discharge Instructions (Signed)
Flank Pain °Flank pain refers to pain that is located on the side of the body between the upper abdomen and the back. The pain may occur over a short period of time (acute) or may be long-term or reoccurring (chronic). It may be mild or severe. Flank pain can be caused by many things. °CAUSES  °Some of the more common causes of flank pain include: °· Muscle strains.   °· Muscle spasms.   °· A disease of your spine (vertebral disk disease).   °· A lung infection (pneumonia).   °· Fluid around your lungs (pulmonary edema).   °· A kidney infection.   °· Kidney stones.   °· A very painful skin rash caused by the chickenpox virus (shingles).   °· Gallbladder disease.   °HOME CARE INSTRUCTIONS  °Home care will depend on the cause of your pain. In general, °· Rest as directed by your caregiver. °· Drink enough fluids to keep your urine clear or pale yellow. °· Only take over-the-counter or prescription medicines as directed by your caregiver. Some medicines may help relieve the pain. °· Tell your caregiver about any changes in your pain. °· Follow up with your caregiver as directed. °SEEK IMMEDIATE MEDICAL CARE IF:  °· Your pain is not controlled with medicine.   °· You have new or worsening symptoms. °· Your pain increases.   °· You have abdominal pain.   °· You have shortness of breath.   °· You have persistent nausea or vomiting.   °· You have swelling in your abdomen.   °· You feel faint or pass out.   °· You have blood in your urine. °· You have a fever or persistent symptoms for more than 2-3 days. °· You have a fever and your symptoms suddenly get worse. °MAKE SURE YOU:  °· Understand these instructions. °· Will watch your condition. °· Will get help right away if you are not doing well or get worse. °Document Released: 11/10/2005 Document Revised: 06/13/2012 Document Reviewed: 05/03/2012 °[employer]® Patient Information ©2015 [employer]. This information is not intended to replace advice given to you by your  health care provider. Make sure you discuss any questions you have with your health care provider. ° °

## 2015-06-08 NOTE — ED Notes (Signed)
Delo MD at bedside. 

## 2015-06-08 NOTE — ED Provider Notes (Signed)
CSN: [id number]     Arrival date & time 06/08/15  1111 History   First MD Initiated Contact with Patient 06/08/15 1155     Chief Complaint  Patient presents with  . Flank Pain    HPI   49 yo F presents with acute onset left flank pain, worse with movement, but sometimes colicky and radiating into her abdomen. She has a history of kidney stones and says this feels like the same thing. No fevers, dysuria, nausea, vomiting, rash.  Past Medical History  Diagnosis Date  . Anxiety   . H/O candidiasis   . IBS (irritable bowel syndrome)   . H/O cystitis     1-2 x a year  . H/O pyelonephritis   . Headache(784.0)     migraines   . Depression   . AMA (advanced maternal age) multigravida 35+   . Kidney stone    Past Surgical History  Procedure Laterality Date  . Left knee surgery    . Wisdom tooth extraction    . Cesarean section     Family History  Problem Relation Age of Onset  . Depression Sister   . Diabetes Brother   . Depression Brother   . Heart disease Maternal Aunt     bypass surgery  . Cancer Maternal Aunt     intestinal    Social History  Substance Use Topics  . Smoking status: Current Every Day Smoker  . Smokeless tobacco: Former Neurosurgeon    Quit date: 10/04/2011  . Alcohol Use: Yes   OB History    Gravida Para Term Preterm AB TAB SAB Ectopic Multiple Living   Review of Systems  Constitutional: Negative for fever and chills.  Eyes: Negative for discharge and redness.  Respiratory: Negative for cough and chest tightness.   Cardiovascular: Negative for chest pain.  Gastrointestinal: Negative for nausea.  Genitourinary: Positive for flank pain. Negative for dysuria, frequency, hematuria, decreased urine volume, vaginal bleeding and vaginal discharge.  Musculoskeletal: Positive for back pain. Negative for arthralgias, gait problem, neck pain and neck stiffness.  Skin: Negative for rash.  Neurological: Negative for light-headedness and  headaches.    Allergies  Review of patient's allergies indicates no known allergies.  Home Medications   Prior to Admission medications   Medication Sig Start Date End Date Taking? Authorizing Provider  baclofen (LIORESAL) 20 MG tablet Take 1 tablet (20 mg total) by mouth 3 (three) times daily. 06/08/15   Selina Cooley, MD  traMADol (ULTRAM) 50 MG tablet Take 1 tablet (50 mg total) by mouth every 6 (six) hours as needed. 06/08/15   Selina Cooley, MD   BP 107/41 mmHg  Pulse 67  Temp(Src) 98.4 F (36.9 C) (Oral)  Resp 16  Ht  (1.702 m)  Wt 170 lb (77.111 kg)  BMI 26.62 kg/m2  SpO2 100%  LMP 03/22/2012   Physical Exam  Constitutional: She appears well-developed and well-nourished.  HENT:  Head: Normocephalic and atraumatic.  Eyes: Conjunctivae and EOM are normal.  Neck: Normal range of motion. Neck supple.  Cardiovascular: Normal rate, regular rhythm and normal heart sounds.   Pulmonary/Chest: Effort normal and breath sounds normal.  Abdominal: Soft. Bowel sounds are normal.  Musculoskeletal:  Pain reproduced on spinal rotation and flexion  Neurological: She displays normal reflexes. No cranial nerve deficit. Coordination normal.  Skin: Skin is warm. No rash noted.    ED Course  Procedures (  including critical care time) Labs Review Labs Reviewed  URINALYSIS, ROUTINE W REFLEX MICROSCOPIC (NOT AT [facility])  PREGNANCY, URINE   Imaging Review Ct Renal Stone Study  06/08/2015   CLINICAL DATA:  Left flank pain. Abdominal distention. Nausea. History of nephrolithiasis and pyelonephritis.  EXAM: CT ABDOMEN AND PELVIS WITHOUT CONTRAST  TECHNIQUE: Multidetector CT imaging of the abdomen and pelvis was performed following the standard protocol without IV contrast.  COMPARISON:  01/28/2011.  FINDINGS: Cholecystectomy clips. 4 mm lower pole left renal calculus. Smaller upper pole left renal calculus. There is also small exophytic cyst arising from the lower pole of the left kidney. There are  several tiny right renal calculi. No ureteral calculi and no hydronephrosis. Bilateral pelvic phleboliths are noted.  Minimal bilateral hip degenerative changes. Lumbar and lower thoracic spine degenerative changes. Small bilateral femoral bone islands and small lower lumbar spine bone island.  Normal non contrasted appearance of the liver, spleen, pancreas, adrenal glands and ovaries. Mildly enlarged and minimally lobulated uterus. No gastrointestinal abnormalities or enlarged lymph nodes. No evidence of appendicitis. Mild atheromatous arterial calcifications. Clear lung bases.  IMPRESSION: 1. Small left renal calculi and tiny right renal calculi. 2. No ureteral calculi or hydronephrosis. 3. Probable minimal uterine fibroid formation.   Electronically Signed   By: Beckie Salts M.D.   On: 06/08/2015 12:49   I have personally reviewed and evaluated these images and lab results as part of my medical decision-making.   EKG Interpretation None      MDM   Final diagnoses:  Acute left flank pain    49 yo F presents with acute onset left sided flank pain, worse with movement, most likely a muscle strain. No red flags. I considered nephrolithiasis given the colicky nature and not tender to palpation but the renal CT was negative. No fevers or chills suggestive of pyelonephritis. This may be early zoster given the dermatomal distribution and odd symptomatology but she did have any vesicles on exam. Rxed baclofen, tramadol, and NSAID for pain and recommended she go to her PCP or urgent care if a rash pops up.  Selina Cooley, MD 06/08/15 1336  Geoffery Lyons, MD 06/09/15 [phone]

## 2015-06-08 NOTE — ED Notes (Signed)
Rx x 2 given for tramadol and baclofen. Instructed to f/u with PCP if pain is not improved or she notices a rash or other symptoms. Pt states her husband is driving her home

## 2015-06-08 NOTE — ED Notes (Signed)
Patient transported to CT 

## 2016-02-20 ENCOUNTER — Encounter (HOSPITAL_COMMUNITY): Payer: Self-pay | Admitting: Emergency Medicine

## 2016-02-20 ENCOUNTER — Emergency Department (HOSPITAL_COMMUNITY)
Admission: EM | Admit: 2016-02-20 | Discharge: 2016-02-20 | Disposition: A | Discharge status: Home / Self Care | Payer: 59 | Attending: Emergency Medicine | Admitting: Emergency Medicine

## 2016-02-20 ENCOUNTER — Emergency Department (HOSPITAL_COMMUNITY): Payer: 59

## 2016-02-20 DIAGNOSIS — Z79899 Other long term (current) drug therapy: Secondary | ICD-10-CM | POA: Insufficient documentation

## 2016-02-20 DIAGNOSIS — Z8619 Personal history of other infectious and parasitic diseases: Secondary | ICD-10-CM | POA: Insufficient documentation

## 2016-02-20 DIAGNOSIS — R0602 Shortness of breath: Secondary | ICD-10-CM | POA: Diagnosis not present

## 2016-02-20 DIAGNOSIS — R232 Flushing: Secondary | ICD-10-CM | POA: Insufficient documentation

## 2016-02-20 DIAGNOSIS — F17200 Nicotine dependence, unspecified, uncomplicated: Secondary | ICD-10-CM | POA: Insufficient documentation

## 2016-02-20 DIAGNOSIS — Z87448 Personal history of other diseases of urinary system: Secondary | ICD-10-CM | POA: Diagnosis not present

## 2016-02-20 DIAGNOSIS — R002 Palpitations: Secondary | ICD-10-CM | POA: Diagnosis not present

## 2016-02-20 DIAGNOSIS — R42 Dizziness and giddiness: Secondary | ICD-10-CM | POA: Diagnosis not present

## 2016-02-20 DIAGNOSIS — R61 Generalized hyperhidrosis: Secondary | ICD-10-CM | POA: Insufficient documentation

## 2016-02-20 DIAGNOSIS — Z8679 Personal history of other diseases of the circulatory system: Secondary | ICD-10-CM | POA: Diagnosis not present

## 2016-02-20 DIAGNOSIS — R079 Chest pain, unspecified: Secondary | ICD-10-CM | POA: Insufficient documentation

## 2016-02-20 DIAGNOSIS — Z87442 Personal history of urinary calculi: Secondary | ICD-10-CM | POA: Diagnosis not present

## 2016-02-20 DIAGNOSIS — Z8719 Personal history of other diseases of the digestive system: Secondary | ICD-10-CM | POA: Insufficient documentation

## 2016-02-20 DIAGNOSIS — R110 Nausea: Secondary | ICD-10-CM | POA: Insufficient documentation

## 2016-02-20 DIAGNOSIS — F410 Panic disorder [episodic paroxysmal anxiety] without agoraphobia: Secondary | ICD-10-CM | POA: Diagnosis not present

## 2016-02-20 DIAGNOSIS — F329 Major depressive disorder, single episode, unspecified: Secondary | ICD-10-CM | POA: Diagnosis not present

## 2016-02-20 LAB — BASIC METABOLIC PANEL
ANION GAP: 8 (ref 5–15)
BUN: 13 mg/dL (ref 6–20)
CO2: 28 mmol/L (ref 22–32)
Calcium: 9.4 mg/dL (ref 8.9–10.3)
Chloride: 105 mmol/L (ref 101–111)
Creatinine, Ser: 0.85 mg/dL (ref 0.44–1.00)
Glucose, Bld: 101 mg/dL — ABNORMAL HIGH (ref 65–99)
Potassium: 4.1 mmol/L (ref 3.5–5.1)
Sodium: 141 mmol/L (ref 135–145)

## 2016-02-20 LAB — CBC
HCT: 36.5 % (ref 36.0–46.0)
HEMOGLOBIN: 12.1 g/dL (ref 12.0–15.0)
MCH: 29.4 pg (ref 26.0–34.0)
MCHC: 33.2 g/dL (ref 30.0–36.0)
MCV: 88.8 fL (ref 78.0–100.0)
Platelets: 293 10*3/uL (ref 150–400)
RBC: 4.11 MIL/uL (ref 3.87–5.11)
RDW: 12.9 % (ref 11.5–15.5)
WBC: 6.8 10*3/uL (ref 4.0–10.5)

## 2016-02-20 LAB — I-STAT TROPONIN, ED: TROPONIN I, POC: 0 ng/mL (ref 0.00–0.08)

## 2016-02-20 NOTE — ED Notes (Addendum)
PT reports yesterday she experienced a squeezing sensation over upper back. This morning around 1130, PT began having left sided chest pain and  pressure without radiation. PT reports nausea and SOB with episode. PT has history of panic attacks and initially thought that is what was happening. No chest pain during triage. PT states, "I feel better all of a sudden." PT had 4mg  Zofran IV, 324 mg ASA, and 1 SL nitro in route. Nitro did not affect pain

## 2016-02-20 NOTE — ED Provider Notes (Signed)
CSN: [id number]     Arrival date & time 02/20/16  1325 History   First MD Initiated Contact with Patient 02/20/16 1337     Chief Complaint  Patient presents with  . Chest Pain     (Consider location/radiation/quality/duration/timing/severity/associated sxs/prior Treatment) HPI Comments: 50 year old female presents with intermittent chest pain for the past day. Past medical history significant for tobacco use, hyperlipidemia, anxiety with panic attacks. No known cardiac history. She states that she started to have chest pain in the left side of her chest which radiates to her left flank for the past several days. She thought it was a panic attack and she took Xanax which made her symptoms slightly better. She also took Tums because she has occasional reflux. It is intermittent and not associated with exertion. Nothing makes it worse. Reports associated dizziness, flushing, palpitations, nausea, shortness of breath. EMS was called and have her take aspirin and nitroglycerin which has relieved her symptoms. She has never had a stress test or catheterization. She is a current smoker.  Patient is a 50 y.o. female presenting with chest pain.  Chest Pain Associated symptoms: diaphoresis, dizziness and palpitations   Associated symptoms: no abdominal pain, no cough, no fever, no nausea, no shortness of breath and not vomiting     Past Medical History  Diagnosis Date  . Anxiety   . H/O candidiasis   . IBS (irritable bowel syndrome)   . H/O cystitis     1-2 x a year  . H/O pyelonephritis   . Headache(784.0)     migraines   . Depression   . AMA (advanced maternal age) multigravida 35+   . Kidney stone    Past Surgical History  Procedure Laterality Date  . Left knee surgery    . Wisdom tooth extraction    . Cesarean section     Family History  Problem Relation Age of Onset  . Depression Sister   . Diabetes Brother   . Depression Brother   . Heart disease Maternal Aunt     bypass  surgery  . Cancer Maternal Aunt     intestinal    Social History  Substance Use Topics  . Smoking status: Current Every Day Smoker  . Smokeless tobacco: Former Neurosurgeon    Quit date: 10/04/2011  . Alcohol Use: Yes     Comment: socially   OB History    Gravida Para Term Preterm AB TAB SAB Ectopic Multiple Living   Review of Systems  Constitutional: Positive for diaphoresis. Negative for fever and chills.  Respiratory: Negative for cough, shortness of breath and wheezing.   Cardiovascular: Positive for chest pain and palpitations. Negative for leg swelling.  Gastrointestinal: Negative for nausea, vomiting, abdominal pain, diarrhea and constipation.  Neurological: Positive for dizziness.  All other systems reviewed and are negative.   Allergies  Review of patient's allergies indicates no known allergies.  Home Medications   Prior to Admission medications   Medication Sig Start Date End Date Taking? Authorizing Provider  ALPRAZolam Prudy Feeler) 0.5 MG tablet Take 0.5 mg by mouth as needed for anxiety.   Yes Historical Provider, MD  baclofen (LIORESAL) 20 MG tablet Take 1 tablet (20 mg total) by mouth 3 (three) times daily. 06/08/15   Selina Cooley, MD  traMADol (ULTRAM) 50 MG tablet Take 1 tablet (50 mg total) by mouth every 6 (six) hours as needed. 06/08/15   Selina Cooley,  MD   BP 98/65 mmHg  Pulse 64  Temp(Src) 98.2 F (36.8 C) (Oral)  Resp 17  Ht 5\' 7"  (1.702 m)  Wt 74.844 kg  BMI 25.84 kg/m2  SpO2 94%  LMP 03/22/2012   Physical Exam  Constitutional: She is oriented to person, place, and time. She appears well-developed and well-nourished. No distress.  HENT:  Head: Normocephalic and atraumatic.  Eyes: Conjunctivae are normal. Pupils are equal, round, and reactive to light. Right eye exhibits no discharge. Left eye exhibits no discharge. No scleral icterus.  Neck: Normal range of motion.  Cardiovascular: Normal rate, regular rhythm and intact distal pulses.   Exam reveals no gallop and no friction rub.   No murmur heard. Pulmonary/Chest: Effort normal and breath sounds normal. No respiratory distress. She has no wheezes. She has no rales. She exhibits no tenderness.  Abdominal: Soft. Bowel sounds are normal. She exhibits no distension and no mass. There is no tenderness. There is no rebound and no guarding.  Neurological: She is alert and oriented to person, place, and time.  Skin: Skin is warm and dry. She is not diaphoretic.  Psychiatric: She has a normal mood and affect.    ED Course  Procedures (including critical care time) Labs Review Labs Reviewed  BASIC METABOLIC PANEL - Abnormal; Notable for the following:    Glucose, Bld 101 (*)    All other components within normal limits  CBC  I-STAT TROPOININ, ED    Imaging Review Dg Chest 2 View  02/20/2016  CLINICAL DATA:  Chest pain EXAM: CHEST  2 VIEW COMPARISON:  12/20/2013 chest radiograph. FINDINGS: Stable cardiomediastinal silhouette with normal heart size. No pneumothorax. No pleural effusion. Lungs appear clear, with no acute consolidative airspace disease and no pulmonary edema. IMPRESSION: No active cardiopulmonary disease. Electronically Signed   By: Delbert PhenixJason A Poff M.D.   On: 02/20/2016 15:10   I have personally reviewed and evaluated these images and lab results as part of my medical decision-making.   EKG Interpretation None      MDM   Final diagnoses:  Chest pain, unspecified chest pain type   50 year old female presents with chest pain for the past day. All vital signs are within normal limits and stable. PE is benign. Low suspicion for ACS or PE however her pain was relieved with ASA and Nitro. Chest pain work up is reassuring. EKG is NSR and shows no significant change since last. CXR is negative. Troponin is 0. Labs are unremarkable. No significant personal or family hx of cardiac disease. Patient is a smoker and has dyslipidemia. HEART score is 3. Recommend follow up  with cardiology for outpatient stress test. Patient is NAD, non-toxic, with stable VS. Patient is informed of clinical course, understands medical decision making process, and agrees with plan. Opportunity for questions provided and all questions answered. Return precautions given.    Bethel BornKelly Marie Abayomi Pattison, PA-C 02/20/16 1557  Rolland PorterMark James, MD 02/25/16 [phone]

## 2016-03-25 ENCOUNTER — Other Ambulatory Visit: Payer: Self-pay | Admitting: Internal Medicine

## 2016-03-25 DIAGNOSIS — E041 Nontoxic single thyroid nodule: Secondary | ICD-10-CM

## 2016-04-12 ENCOUNTER — Ambulatory Visit: Payer: Self-pay | Admitting: Neurology

## 2016-05-04 ENCOUNTER — Inpatient Hospital Stay
Admission: RE | Admit: 2016-05-04 | Discharge: 2016-05-04 | Disposition: A | Discharge status: Home / Self Care | Payer: Self-pay | Source: Ambulatory Visit | Attending: Internal Medicine | Admitting: Internal Medicine

## 2016-05-04 ENCOUNTER — Other Ambulatory Visit: Payer: Self-pay | Admitting: Internal Medicine

## 2016-05-04 DIAGNOSIS — E041 Nontoxic single thyroid nodule: Secondary | ICD-10-CM

## 2016-05-10 ENCOUNTER — Ambulatory Visit
Admission: RE | Admit: 2016-05-10 | Discharge: 2016-05-10 | Disposition: A | Discharge status: Home / Self Care | Payer: 59 | Source: Ambulatory Visit | Attending: Internal Medicine | Admitting: Internal Medicine

## 2016-05-10 ENCOUNTER — Other Ambulatory Visit (HOSPITAL_COMMUNITY)
Admission: RE | Admit: 2016-05-10 | Discharge: 2016-05-10 | Disposition: A | Discharge status: Home / Self Care | Payer: 59 | Source: Ambulatory Visit | Attending: Radiology | Admitting: Radiology

## 2016-05-10 DIAGNOSIS — E041 Nontoxic single thyroid nodule: Secondary | ICD-10-CM | POA: Insufficient documentation

## 2016-06-25 ENCOUNTER — Emergency Department (HOSPITAL_BASED_OUTPATIENT_CLINIC_OR_DEPARTMENT_OTHER): Payer: 59

## 2016-06-25 ENCOUNTER — Encounter (HOSPITAL_BASED_OUTPATIENT_CLINIC_OR_DEPARTMENT_OTHER): Payer: Self-pay | Admitting: Emergency Medicine

## 2016-06-25 ENCOUNTER — Emergency Department (HOSPITAL_BASED_OUTPATIENT_CLINIC_OR_DEPARTMENT_OTHER)
Admission: EM | Admit: 2016-06-25 | Discharge: 2016-06-25 | Disposition: A | Discharge status: Home / Self Care | Payer: 59 | Attending: Emergency Medicine | Admitting: Emergency Medicine

## 2016-06-25 DIAGNOSIS — Z79899 Other long term (current) drug therapy: Secondary | ICD-10-CM | POA: Diagnosis not present

## 2016-06-25 DIAGNOSIS — M542 Cervicalgia: Secondary | ICD-10-CM | POA: Diagnosis not present

## 2016-06-25 DIAGNOSIS — Z87891 Personal history of nicotine dependence: Secondary | ICD-10-CM | POA: Insufficient documentation

## 2016-06-25 MED ORDER — CYCLOBENZAPRINE HCL 5 MG PO TABS
5.0000 mg | ORAL_TABLET | Freq: Once | ORAL | Status: AC
  Administered 2016-06-25: 5 mg via ORAL
  Filled 2016-06-25: qty 1

## 2016-06-25 MED ORDER — CYCLOBENZAPRINE HCL 10 MG PO TABS: 10.0000 mg | ORAL_TABLET | Freq: Two times a day (BID) | ORAL | 0 refills | Status: AC | PRN

## 2016-06-25 MED ORDER — NAPROXEN 500 MG PO TABS: 500.0000 mg | ORAL_TABLET | Freq: Two times a day (BID) | ORAL | 0 refills | Status: AC

## 2016-06-25 MED ORDER — NAPROXEN 250 MG PO TABS
500.0000 mg | ORAL_TABLET | Freq: Once | ORAL | Status: AC
  Administered 2016-06-25: 500 mg via ORAL
  Filled 2016-06-25: qty 2

## 2016-06-25 NOTE — ED Triage Notes (Signed)
Pt reports neck pain and stiffness x 3 days. No specific injury to report. Pt has taken ibuprofen without relief.

## 2016-06-25 NOTE — ED Provider Notes (Signed)
[facility] Provider Note   CSN: [id number] Arrival date & time: 06/25/16  1045     History   Chief Complaint Chief Complaint  Patient presents with  . Neck Pain    HPI [name] is a 50 y.o. female.  HPI  [name] is a 50 y.o. female with PMH significant for anxiety, depression, migraines, IBS, nephrolithiasis who presents with 3 days of atraumatic, constant, moderate to severe neck pain that will occasionally radiate down her spine and to the front of her neck. She states she did carry her daughter's backpack a couple of days ago prior to symptom onset. She's been taking ibuprofen with some relief. She denies any numbness, tingling, chest pain, shortness of breath, dizziness, headache, visual disturbance, anterior neck pain, dysphagia, odynophagia, nausea, vomiting, cough, or urinary symptoms. She has not had any fevers. Pain is worse with movement and palpation.  Past Medical History:  Diagnosis Date  . AMA (advanced maternal age) multigravida 35+   . Anxiety   . Depression   . H/O candidiasis   . H/O cystitis    1-2 x a year  . H/O pyelonephritis   . Headache(784.0)    migraines   . IBS (irritable bowel syndrome)   . Kidney stone     Patient Active Problem List   Diagnosis Date Noted  . Thyroid nodule 12/28/2012  . Anxiety     Past Surgical History:  Procedure Laterality Date  . CESAREAN SECTION    . left knee surgery    . WISDOM TOOTH EXTRACTION      OB History    Gravida Para Term Preterm AB Living   4 3 3   1 3    SAB TAB Ectopic Multiple Live Births     1     3       Home Medications    Prior to Admission medications   Medication Sig Start Date End Date Taking? Authorizing Provider  ALPRAZolam Prudy Feeler(XANAX) 0.5 MG tablet Take 0.5 mg by mouth as needed for anxiety.   Yes Historical Provider, MD  atorvastatin (LIPITOR) 10 MG tablet Take 10 mg by mouth daily.   Yes Historical Provider, MD  baclofen (LIORESAL) 20 MG tablet Take 1  tablet (20 mg total) by mouth 3 (three) times daily. 06/08/15   Selina CooleyKyle Flores, MD  cyclobenzaprine (FLEXERIL) 10 MG tablet Take 1 tablet (10 mg total) by mouth 2 (two) times daily as needed for muscle spasms. 06/25/16   Cheri FowlerKayla Britton Perkinson, PA-C  naproxen (NAPROSYN) 500 MG tablet Take 1 tablet (500 mg total) by mouth 2 (two) times daily. 06/25/16   Cheri FowlerKayla Frazer [place], PA-C  traMADol (ULTRAM) 50 MG tablet Take 1 tablet (50 mg total) by mouth every 6 (six) hours as needed. 06/08/15   Selina CooleyKyle Flores, MD    Family History Family History  Problem Relation Age of Onset  . Depression Sister   . Diabetes Brother   . Depression Brother   . Heart disease Maternal Aunt     bypass surgery  . Cancer Maternal Aunt     intestinal     Social History Social History  Substance Use Topics  . Smoking status: Former Games developermoker  . Smokeless tobacco: Current User    Last attempt to quit: 10/04/2011  . Alcohol use Yes     Comment: socially     Allergies   Review of patient's allergies indicates no known allergies.   Review of Systems Review of Systems All other  systems negative unless otherwise stated in HPI   Physical Exam Updated Vital Signs BP 105/77 (BP Location: Right Arm)   Pulse (!) 55   Temp 98.3 F (36.8 C) (Oral)   Resp 16   Ht 5\' 7"  (1.702 m)   Wt 75.8 kg   LMP 03/22/2012   SpO2 100%   BMI 26.16 kg/m   Physical Exam  Constitutional: She is oriented to person, place, and time. She appears well-developed and well-nourished.  Non-toxic appearance. She does not have a sickly appearance. She does not appear ill.  HENT:  Head: Normocephalic and atraumatic.  Mouth/Throat: Oropharynx is clear and moist.  Eyes: Conjunctivae are normal. Pupils are equal, round, and reactive to light.  Neck: Normal range of motion. Neck supple.  Diffuse cervical tenderness including paracervical musculature and trapezius (R>L).  Decreased ROM in lateral flexion (R>L).  No meningismus.   Cardiovascular: Normal rate and regular  rhythm.   Pulmonary/Chest: Effort normal and breath sounds normal. No accessory muscle usage or stridor. No respiratory distress. She has no wheezes. She has no rhonchi. She has no rales.  Abdominal: Soft. Bowel sounds are normal. She exhibits no distension. There is no tenderness.  Musculoskeletal: Normal range of motion.  No t/l/s midline tenderness.   Lymphadenopathy:    She has no cervical adenopathy.  Neurological: She is alert and oriented to person, place, and time.  Mental Status:   AOx3.  Speech clear without dysarthria. Cranial Nerves:  I-not tested  II-PERRLA  III, IV, VI-EOMs intact  V-temporal and masseter strength intact  VII-symmetrical facial movements intact, no facial droop  VIII-hearing grossly intact bilaterally  IX, X-gag intact  XI-strength of sternomastoid and trapezius muscles 5/5  XII-tongue midline Motor:   Good muscle bulk and tone  Strength 5/5 bilaterally in upper and lower extremities   Cerebellar--intact RAMs, finger to nose intact bilaterally.    No pronator drift Sensory:  Intact in upper and lower extremities   Skin: Skin is warm and dry.  Psychiatric: She has a normal mood and affect. Her behavior is normal.     ED Treatments / Results  Labs (all labs ordered are listed, but only abnormal results are displayed) Labs Reviewed - No data to display  EKG  EKG Interpretation None       Radiology Dg Cervical Spine Complete  Result Date: 06/25/2016 CLINICAL DATA:  Posterior cervical pain x 3 days with NKI. Pt states she has a hx of a buldging cervical disk but is unsure of which one and usually can manage pain with exercises. Pt states she carried her daughters heavy book bag EXAM: CERVICAL SPINE - COMPLETE 4+ VIEW COMPARISON:  None. FINDINGS: Normal alignment. Mild narrowing of the C4-5 interspace. Uncovertebral spurring at this level results in early encroachment upon the neural foramina. Early anterior endplate spurring W0-J8. No  prevertebral soft tissue swelling. Negative for fracture or dislocation. IMPRESSION: 1. Negative for fracture or other acute abnormality. 2. 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 Electronically Signed   By: Corlis Leak M.D.   On: 06/25/2016 12:56    Procedures Procedures (including critical care time)  Medications Ordered in ED Medications  cyclobenzaprine (FLEXERIL) tablet 5 mg (5 mg Oral Given 06/25/16 1153)  naproxen (NAPROSYN) tablet 500 mg (500 mg Oral Given 06/25/16 1152)     Initial Impression / Assessment and Plan / ED Course  I have reviewed the triage vital signs and the nursing notes.  Pertinent labs & imaging  results that were available during my care of the patient were reviewed by me and considered in my medical decision making (see chart for details).  Clinical Course   Patient presents with neck pain and stiffness x 3 days. Consider dissection, by atypical presentation and no neurological deficits on exam.  Consider meningitis, but afebrile, no meningismus, no findings concerning for infection in history.  No neurological deficits and pain is not radicular in nature.  No indication for emergent advanced imaging at this time.  Plain films negative for acute abnormalities, DDD.  Suspect musculoskeletal etiology. Patient improved.  Home with Flexeril and Naproxen.  Follow up PCP.  Return precautions discussed. Stable for discharge.    Final Clinical Impressions(s) / ED Diagnoses   Final diagnoses:  Neck pain    New Prescriptions New Prescriptions   CYCLOBENZAPRINE (FLEXERIL) 10 MG TABLET    Take 1 tablet (10 mg total) by mouth 2 (two) times daily as needed for muscle spasms.   NAPROXEN (NAPROSYN) 500 MG TABLET    Take 1 tablet (500 mg total) by mouth 2 (two) times daily.     Cheri Fowler, PA-C 06/25/16 1350    Arby Barrette, MD 06/25/16 [phone]

## 2016-06-25 NOTE — Discharge Instructions (Signed)
Your xray today does not show any acute abnormalities.  It does show some degenerative disc disease at C3-4.  You pain is likely musculoskeletal in nature.  Please take Naproxen and Flexeril twice daily for muscle stiffness and pain.  You may also try heat.  Follow up with your primary care physician.  Return sooner if you experience worsening pain, numbness, weakness, severe headache, dizziness, or any new symptoms.

## 2017-02-13 ENCOUNTER — Emergency Department (HOSPITAL_COMMUNITY): Payer: 59

## 2017-02-13 ENCOUNTER — Encounter (HOSPITAL_COMMUNITY): Payer: Self-pay | Admitting: Emergency Medicine

## 2017-02-13 ENCOUNTER — Emergency Department (HOSPITAL_COMMUNITY)
Admission: EM | Admit: 2017-02-13 | Discharge: 2017-02-13 | Disposition: A | Discharge status: Home / Self Care | Payer: 59 | Attending: Emergency Medicine | Admitting: Emergency Medicine

## 2017-02-13 ENCOUNTER — Emergency Department (HOSPITAL_COMMUNITY)
Admit: 2017-02-13 | Discharge: 2017-02-13 | Disposition: A | Discharge status: Home / Self Care | Payer: 59 | Attending: Emergency Medicine | Admitting: Emergency Medicine

## 2017-02-13 DIAGNOSIS — Z87891 Personal history of nicotine dependence: Secondary | ICD-10-CM | POA: Insufficient documentation

## 2017-02-13 DIAGNOSIS — M25511 Pain in right shoulder: Secondary | ICD-10-CM | POA: Insufficient documentation

## 2017-02-13 MED ORDER — HYDROCODONE-ACETAMINOPHEN 5-325 MG PO TABS
1.0000 | ORAL_TABLET | Freq: Once | ORAL | Status: AC
  Administered 2017-02-13: 1 via ORAL
  Filled 2017-02-13: qty 1

## 2017-02-13 MED ORDER — HYDROCODONE-ACETAMINOPHEN 5-325 MG PO TABS: 1.0000 | ORAL_TABLET | Freq: Four times a day (QID) | ORAL | 0 refills | Status: AC | PRN

## 2017-02-13 MED ORDER — METHOCARBAMOL 500 MG PO TABS: 500.0000 mg | ORAL_TABLET | Freq: Every evening | ORAL | 0 refills | Status: AC | PRN

## 2017-02-13 MED ORDER — IBUPROFEN 600 MG PO TABS: 600.0000 mg | ORAL_TABLET | Freq: Four times a day (QID) | ORAL | 0 refills | Status: AC | PRN

## 2017-02-13 MED ORDER — IBUPROFEN 400 MG PO TABS
600.0000 mg | ORAL_TABLET | Freq: Once | ORAL | Status: AC
  Administered 2017-02-13: 600 mg via ORAL
  Filled 2017-02-13: qty 1

## 2017-02-13 MED ORDER — METHOCARBAMOL 500 MG PO TABS
500.0000 mg | ORAL_TABLET | Freq: Once | ORAL | Status: AC
  Administered 2017-02-13: 500 mg via ORAL
  Filled 2017-02-13: qty 1

## 2017-02-13 NOTE — Progress Notes (Signed)
VASCULAR LAB PRELIMINARY  PRELIMINARY  PRELIMINARY  PRELIMINARY  Right upper extremity venous duplex completed.    Preliminary report:  Right :  No evidence of DVT or superficial thrombosis.    Shed Nixon, RVS 02/13/2017, 11:19 AM

## 2017-02-13 NOTE — Progress Notes (Signed)
Orthopedic Tech Progress Note Patient Details:  Jill PereyraJanet B Haynes Feb 29, 1966 [id number]  Ortho Devices Type of Ortho Device: Arm sling Ortho Device/Splint Interventions: Application   Jill FordyceJennifer C Rufino Haynes 02/13/2017, 2:11 PM

## 2017-02-13 NOTE — Discharge Instructions (Signed)
Rest your shoulder Ice 20 minutes at a time several times a day Wear sling for comfort and do range of motion exercises daily Take Ibuprofen three times daily. Take Robaxin as needed (muscle relaxer) Take pain medicine as needed. Do not drive or drink alcohol with this medicine. Follow up with orthopedics

## 2017-02-13 NOTE — ED Provider Notes (Signed)
[facility] Provider Note   CSN: [id number] Arrival date & time: 02/13/17  1008     History   Chief Complaint Chief Complaint  Patient presents with  . Shoulder Pain    HPI [name] is a 51 y.o.  who presents with right shoulder pain. She states that she went kayaking several days ago and then yesterday developed an achy pain in her right shoulder. Today she woke up and the pain was severe. She notes some swelling in her hand as well. The pain radiates from her shoulder to her left anterior chest and down the back of her arm. She reports that sometimes she has numbness and tingling in her fingers but none currently. She took an Aleve and hydrocodone which helped the pain. No falls or acute trauma.  HPI  Past Medical History:  Diagnosis Date  . AMA (advanced maternal age) multigravida 35+   . Anxiety   . Depression   . H/O candidiasis   . H/O cystitis    1-2 x a year  . H/O pyelonephritis   . Headache(784.0)    migraines   . IBS (irritable bowel syndrome)   . Kidney stone     Patient Active Problem List   Diagnosis Date Noted  . Thyroid nodule 12/28/2012  . Anxiety     Past Surgical History:  Procedure Laterality Date  . CESAREAN SECTION    . left knee surgery    . WISDOM TOOTH EXTRACTION      OB History    Gravida Para Term Preterm AB Living   4 3 3   1 3    SAB TAB Ectopic Multiple Live Births     1     3       Home Medications    Prior to Admission medications   Medication Sig Start Date End Date Taking? Authorizing Provider  ALPRAZolam Prudy Feeler) 0.5 MG tablet Take 0.5 mg by mouth as needed for anxiety.    [provider]  atorvastatin (LIPITOR) 10 MG tablet Take 10 mg by mouth daily.    [provider]  baclofen (LIORESAL) 20 MG tablet Take 1 tablet (20 mg total) by mouth 3 (three) times daily. 06/08/15   Selina Cooley, MD  cyclobenzaprine (FLEXERIL) 10 MG tablet Take 1 tablet (10 mg total) by mouth 2 (two) times daily as  needed for muscle spasms. 06/25/16   Cheri Fowler, PA-C  naproxen (NAPROSYN) 500 MG tablet Take 1 tablet (500 mg total) by mouth 2 (two) times daily. 06/25/16   Cheri Fowler, PA-C  traMADol (ULTRAM) 50 MG tablet Take 1 tablet (50 mg total) by mouth every 6 (six) hours as needed. 06/08/15   Selina Cooley, MD    Family History Family History  Problem Relation Age of Onset  . Depression Sister   . Diabetes Brother   . Depression Brother   . Heart disease Maternal Aunt        bypass surgery  . Cancer Maternal Aunt        intestinal     Social History Social History  Substance Use Topics  . Smoking status: Former Games developer  . Smokeless tobacco: Current User    Last attempt to quit: 10/04/2011  . Alcohol use Yes     Comment: socially     Allergies   Patient has no known allergies.   Review of Systems Review of Systems  Respiratory: Negative for shortness of breath.   Cardiovascular: Positive for chest pain.  Musculoskeletal: Positive for arthralgias, joint swelling and myalgias.  Neurological: Positive for numbness. Negative for weakness.  All other systems reviewed and are negative.    Physical Exam Updated Vital Signs BP (!) 108/57 (BP Location: Left Arm)   Pulse 71   Temp 97.5 F (36.4 C) (Oral)   Resp 16   LMP 03/22/2012   SpO2 98%   Physical Exam  Constitutional: She is oriented to person, place, and time. She appears well-developed and well-nourished. No distress.  HENT:  Head: Normocephalic and atraumatic.  Eyes: Conjunctivae are normal. Pupils are equal, round, and reactive to light. Right eye exhibits no discharge. Left eye exhibits no discharge. No scleral icterus.  Neck: Normal range of motion.  Cardiovascular: Normal rate.   Pulmonary/Chest: Effort normal. No respiratory distress.  Abdominal: She exhibits no distension.  Musculoskeletal:  Right upper extremity: There is moderate swelling in the hand. Significant tenderness to palpation of acromion, biceps  tendon, upper left chest wall, posterior shoulder and arm. ROM of elbow is normal. ROM of shoulder deferred due to pain. She is able to hold shoulder in 90 degree flexion actively. N/V intact.   Neurological: She is alert and oriented to person, place, and time.  Skin: Skin is warm and dry.  Psychiatric: She has a normal mood and affect. Her behavior is normal.  Nursing note and vitals reviewed.    ED Treatments / Results  Labs (all labs ordered are listed, but only abnormal results are displayed) Labs Reviewed - No data to display  EKG  EKG Interpretation None       Radiology No results found.  Procedures Procedures (including critical care time)  Medications Ordered in ED Medications - No data to display   Initial Impression / Assessment and Plan / ED Course  I have reviewed the triage vital signs and the nursing notes.  Pertinent labs & imaging results that were available during my care of the patient were reviewed by me and considered in my medical decision making (see chart for details).  51 year old female with rotator cuff tendonitis and biceps tendonitis. She is able to actively hold her arm in 90 degree flexion - doubt tendon rupture. Her blood pressure is low but this is normal for her on review of EMR. Xray remarkable for arthritis. Will place in sling for comfort and rx for pain meds and muscle relaxer given. Advised follow up with Ortho or PCP. Return precautions given.  Final Clinical Impressions(s) / ED Diagnoses   Final diagnoses:  Acute pain of right shoulder    New Prescriptions New Prescriptions   No medications on file     Bethel BornGekas, Korah Hufstedler Marie, PA-C 02/17/17 1511    Lavera GuiseLiu, Dana Duo, MD 02/17/17 2200

## 2017-02-13 NOTE — ED Notes (Signed)
Patient transported to X-ray 

## 2017-02-13 NOTE — ED Triage Notes (Signed)
Pt reports right shoulder pain that began suddenly yesterday. Pt has swelling present to lower right arm. Pulse intact.

## 2017-02-13 NOTE — ED Notes (Signed)
Pt returns from xray

## 2017-02-13 NOTE — ED Notes (Signed)
Pt and husband state they understand instructions . Home stable with steady gait.

## 2017-03-17 ENCOUNTER — Other Ambulatory Visit: Payer: Self-pay | Admitting: Orthopedic Surgery

## 2017-03-17 DIAGNOSIS — M25511 Pain in right shoulder: Secondary | ICD-10-CM

## 2017-03-22 ENCOUNTER — Other Ambulatory Visit: Payer: Self-pay | Admitting: Physician Assistant

## 2017-03-22 DIAGNOSIS — R202 Paresthesia of skin: Secondary | ICD-10-CM

## 2017-03-22 DIAGNOSIS — M542 Cervicalgia: Secondary | ICD-10-CM

## 2017-03-22 DIAGNOSIS — R200 Anesthesia of skin: Secondary | ICD-10-CM

## 2017-03-25 ENCOUNTER — Other Ambulatory Visit: Payer: 59

## 2017-04-04 ENCOUNTER — Ambulatory Visit
Admission: RE | Admit: 2017-04-04 | Discharge: 2017-04-04 | Disposition: A | Discharge status: Home / Self Care | Payer: 59 | Source: Ambulatory Visit | Attending: Physician Assistant | Admitting: Physician Assistant

## 2017-04-04 DIAGNOSIS — R200 Anesthesia of skin: Secondary | ICD-10-CM

## 2017-04-04 DIAGNOSIS — R202 Paresthesia of skin: Secondary | ICD-10-CM

## 2017-04-04 DIAGNOSIS — M542 Cervicalgia: Secondary | ICD-10-CM

## 2017-04-10 ENCOUNTER — Other Ambulatory Visit: Payer: 59

## 2017-04-29 ENCOUNTER — Emergency Department (HOSPITAL_COMMUNITY)
Admission: EM | Admit: 2017-04-29 | Discharge: 2017-04-29 | Disposition: A | Discharge status: Home / Self Care | Payer: 59 | Attending: Emergency Medicine | Admitting: Emergency Medicine

## 2017-04-29 ENCOUNTER — Encounter (HOSPITAL_COMMUNITY): Payer: Self-pay

## 2017-04-29 DIAGNOSIS — G8929 Other chronic pain: Secondary | ICD-10-CM | POA: Diagnosis not present

## 2017-04-29 DIAGNOSIS — Z79899 Other long term (current) drug therapy: Secondary | ICD-10-CM | POA: Diagnosis not present

## 2017-04-29 DIAGNOSIS — M542 Cervicalgia: Secondary | ICD-10-CM | POA: Diagnosis present

## 2017-04-29 DIAGNOSIS — M25512 Pain in left shoulder: Secondary | ICD-10-CM | POA: Insufficient documentation

## 2017-04-29 DIAGNOSIS — Z87891 Personal history of nicotine dependence: Secondary | ICD-10-CM | POA: Insufficient documentation

## 2017-04-29 HISTORY — DX: Other cervical disc degeneration, unspecified cervical region: M50.30

## 2017-04-29 HISTORY — DX: Other cervical disc displacement, unspecified cervical region: M50.20

## 2017-04-29 MED ORDER — DEXAMETHASONE SODIUM PHOSPHATE 10 MG/ML IJ SOLN
10.0000 mg | Freq: Once | INTRAMUSCULAR | Status: AC
  Administered 2017-04-29: 10 mg via INTRAMUSCULAR
  Filled 2017-04-29: qty 1

## 2017-04-29 MED ORDER — OXYCODONE-ACETAMINOPHEN 5-325 MG PO TABS: 1.0000 | ORAL_TABLET | ORAL | 0 refills | Status: AC | PRN

## 2017-04-29 MED ORDER — HYDROMORPHONE HCL 1 MG/ML IJ SOLN
0.5000 mg | Freq: Once | INTRAMUSCULAR | Status: AC
  Administered 2017-04-29: 0.5 mg via INTRAMUSCULAR
  Filled 2017-04-29: qty 1
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
## 2017-04-29 NOTE — ED Notes (Signed)
Pt is tearful, c/o neck pain radiating into the L arm, states she has seen a surgeon who offered little help. Pt is frustrated.

## 2017-04-29 NOTE — Discharge Instructions (Signed)
Please continue your prescribed medicines Take pain medicine for severe pain only Call your doctor on Monday

## 2017-05-07 ENCOUNTER — Other Ambulatory Visit: Payer: Self-pay | Admitting: Internal Medicine

## 2017-08-01 ENCOUNTER — Ambulatory Visit
Admission: RE | Admit: 2017-08-01 | Discharge: 2017-08-01 | Disposition: A | Discharge status: Home / Self Care | Payer: 59 | Source: Ambulatory Visit | Attending: Physician Assistant | Admitting: Physician Assistant

## 2017-08-01 ENCOUNTER — Other Ambulatory Visit: Payer: Self-pay | Admitting: Physician Assistant

## 2017-08-01 DIAGNOSIS — M79671 Pain in right foot: Secondary | ICD-10-CM

## 2017-08-01 DIAGNOSIS — M25571 Pain in right ankle and joints of right foot: Secondary | ICD-10-CM

## 2017-12-11 ENCOUNTER — Ambulatory Visit: Payer: 59 | Admitting: Podiatry

## 2018-01-04 ENCOUNTER — Ambulatory Visit
Admission: RE | Admit: 2018-01-04 | Discharge: 2018-01-04 | Disposition: A | Discharge status: Home / Self Care | Payer: 59 | Source: Ambulatory Visit | Attending: Internal Medicine | Admitting: Internal Medicine

## 2018-01-04 ENCOUNTER — Other Ambulatory Visit: Payer: Self-pay | Admitting: Internal Medicine

## 2018-01-04 DIAGNOSIS — Z808 Family history of malignant neoplasm of other organs or systems: Secondary | ICD-10-CM

## 2018-01-04 DIAGNOSIS — E042 Nontoxic multinodular goiter: Secondary | ICD-10-CM

## 2018-01-09 ENCOUNTER — Other Ambulatory Visit: Payer: 59

## 2018-09-17 ENCOUNTER — Other Ambulatory Visit: Payer: Self-pay

## 2018-09-17 ENCOUNTER — Emergency Department (HOSPITAL_COMMUNITY): Payer: 59

## 2018-09-17 ENCOUNTER — Emergency Department (HOSPITAL_COMMUNITY)
Admission: EM | Admit: 2018-09-17 | Discharge: 2018-09-17 | Disposition: A | Discharge status: Home / Self Care | Payer: 59 | Attending: Emergency Medicine | Admitting: Emergency Medicine

## 2018-09-17 ENCOUNTER — Encounter (HOSPITAL_COMMUNITY): Payer: Self-pay

## 2018-09-17 DIAGNOSIS — Z87891 Personal history of nicotine dependence: Secondary | ICD-10-CM | POA: Diagnosis not present

## 2018-09-17 DIAGNOSIS — R05 Cough: Secondary | ICD-10-CM | POA: Diagnosis present

## 2018-09-17 DIAGNOSIS — J069 Acute upper respiratory infection, unspecified: Secondary | ICD-10-CM | POA: Diagnosis not present

## 2018-09-17 MED ORDER — IPRATROPIUM-ALBUTEROL 0.5-2.5 (3) MG/3ML IN SOLN
3.0000 mL | Freq: Once | RESPIRATORY_TRACT | Status: AC
  Administered 2018-09-17: 3 mL via RESPIRATORY_TRACT
  Filled 2018-09-17: qty 3

## 2018-09-17 MED ORDER — ALBUTEROL SULFATE HFA 108 (90 BASE) MCG/ACT IN AERS: 1.0000 | INHALATION_SPRAY | Freq: Four times a day (QID) | RESPIRATORY_TRACT | 0 refills | Status: AC | PRN

## 2018-09-17 NOTE — ED Provider Notes (Signed)
[facility] EMERGENCY DEPARTMENT Provider Note   CSN: [id number] Arrival date & time: 09/17/18  1224     History   Chief Complaint Chief Complaint  Patient presents with  . Cough  . Nasal Congestion    HPI [name] is a 52 y.o. female.  HPI   52 year old female presents today with complaints of upper respiratory infection and cough.  Patient notes 2 weeks ago she developed rhinorrhea nasal congestion cough and fever.  She notes it has been productive since that time.  She was seen and started on doxycycline but had an allergic reaction, she was switched to Levaquin.  She has 1 Levaquin pill left presently.  She notes using cough medication without significant improvement in symptoms.  She feels hot but denies objective fever at home.  She feels at night symptoms are worse.  She denies any significant smoking history noting she " smokes socially" which is approximately once or twice per month.  No lower extremity swelling or edema.    Past Medical History:  Diagnosis Date  . AMA (advanced maternal age) multigravida 35+   . Anxiety   . Bulging of cervical intervertebral disc   . Depression   . H/O candidiasis   . H/O cystitis    1-2 x a year  . H/O pyelonephritis   . Headache(784.0)    migraines   . IBS (irritable bowel syndrome)   . Kidney stone     Patient Active Problem List   Diagnosis Date Noted  . Thyroid nodule 12/28/2012  . Anxiety     Past Surgical History:  Procedure Laterality Date  . CESAREAN SECTION    . left knee surgery    . WISDOM TOOTH EXTRACTION       OB History    Gravida  4   Para  3   Term  3   Preterm      AB  1   Living  3     SAB      TAB  1   Ectopic      Multiple      Live Births  3            Home Medications    Prior to Admission medications   Medication Sig Start Date End Date Taking? Authorizing Provider  albuterol (PROVENTIL HFA;VENTOLIN HFA) 108 (90 Base) MCG/ACT inhaler  Inhale 1-2 puffs into the lungs every 6 (six) hours as needed for wheezing or shortness of breath. 09/17/18   Analisa Sledd, Tinnie Gens, PA-C  ALPRAZolam Prudy Feeler) 0.5 MG tablet Take 0.5 mg by mouth as needed for anxiety.    [provider]  atorvastatin (LIPITOR) 10 MG tablet Take 10 mg by mouth daily.    [provider]  baclofen (LIORESAL) 20 MG tablet Take 1 tablet (20 mg total) by mouth 3 (three) times daily. 06/08/15   Selina Cooley, MD  cyclobenzaprine (FLEXERIL) 10 MG tablet Take 1 tablet (10 mg total) by mouth 2 (two) times daily as needed for muscle spasms. 06/25/16   Cheri Fowler, PA-C  HYDROcodone-acetaminophen (NORCO/VICODIN) 5-325 MG tablet Take 1 tablet by mouth every 6 (six) hours as needed. 02/13/17   Bethel Born, PA-C  ibuprofen (ADVIL,MOTRIN) 600 MG tablet Take 1 tablet (600 mg total) by mouth every 6 (six) hours as needed. 02/13/17   Bethel Born, PA-C  methocarbamol (ROBAXIN) 500 MG tablet Take 1 tablet (500 mg total) by mouth at bedtime and may repeat dose one  time if needed. 02/13/17   Bethel BornGekas, Kelly Marie, PA-C  naproxen (NAPROSYN) 500 MG tablet Take 1 tablet (500 mg total) by mouth 2 (two) times daily. 06/25/16   Cheri Fowlerose, Kayla, PA-C  oxyCODONE-acetaminophen (PERCOCET/ROXICET) 5-325 MG tablet Take 1 tablet by mouth every 4 (four) hours as needed for severe pain. 04/29/17   Bethel BornGekas, Kelly Marie, PA-C  traMADol (ULTRAM) 50 MG tablet Take 1 tablet (50 mg total) by mouth every 6 (six) hours as needed. 06/08/15   Selina CooleyFlores, Kyle, MD    Family History Family History  Problem Relation Age of Onset  . Depression Sister   . Diabetes Brother   . Depression Brother   . Heart disease Maternal Aunt        bypass surgery  . Cancer Maternal Aunt        intestinal     Social History Social History   Tobacco Use  . Smoking status: Former Games developermoker  . Smokeless tobacco: Current User    Last attempt to quit: 10/04/2011  Substance Use Topics  . Alcohol use: Yes    Comment: socially  .  Drug use: No     Allergies   Doxycycline   Review of Systems Review of Systems  All other systems reviewed and are negative.    Physical Exam Updated Vital Signs BP (!) 122/54 (BP Location: Right Arm)   Pulse 76   Temp 98.1 F (36.7 C) (Oral)   Resp 18   LMP 03/22/2012   SpO2 96%   Physical Exam Vitals signs and nursing note reviewed.  Constitutional:      Appearance: She is well-developed.  HENT:     Head: Normocephalic and atraumatic.     Comments: Rhinorrhea noted Eyes:     General: No scleral icterus.       Right eye: No discharge.        Left eye: No discharge.     Conjunctiva/sclera: Conjunctivae normal.     Pupils: Pupils are equal, round, and reactive to light.  Neck:     Musculoskeletal: Normal range of motion.     Vascular: No JVD.     Trachea: No tracheal deviation.  Pulmonary:     Effort: Pulmonary effort is normal. No respiratory distress.     Breath sounds: No stridor. No wheezing, rhonchi or rales.     Comments: Nonproductive cough Musculoskeletal:     Comments: No edema  Neurological:     Mental Status: She is alert and oriented to person, place, and time.     Coordination: Coordination normal.  Psychiatric:        Behavior: Behavior normal.        Thought Content: Thought content normal.        Judgment: Judgment normal.      ED Treatments / Results  Labs (all labs ordered are listed, but only abnormal results are displayed) Labs Reviewed - No data to display  EKG None  Radiology Dg Chest 2 View  Result Date: 09/17/2018 CLINICAL DATA:  Cough 2 weeks EXAM: CHEST - 2 VIEW COMPARISON:  09/22/2016 FINDINGS: The heart size and mediastinal contours are within normal limits. Both lungs are clear. The visualized skeletal structures are unremarkable. IMPRESSION: No active cardiopulmonary disease. Electronically Signed   By: Marlan Palauharles  Clark M.D.   On: 09/17/2018 14:25    Procedures Procedures (including critical care time)  Medications  Ordered in ED Medications  ipratropium-albuterol (DUONEB) 0.5-2.5 (3) MG/3ML nebulizer solution 3 mL (3 mLs Nebulization Given 09/17/18  1357)     Initial Impression / Assessment and Plan / ED Course  I have reviewed the triage vital signs and the nursing notes.  Pertinent labs & imaging results that were available during my care of the patient were reviewed by me and considered in my medical decision making (see chart for details).     52 year old female presents today with cough.  This is likely status post upper respiratory infection.  She has no signs of bacterial etiology on my exam.  She has a negative chest x-ray and clear lung sounds.  Her cough improved with breathing treatment.  Patient is finishing a course of Levaquin no indication for further antibiotic therapy at this time.  Encouraged to follow-up as an outpatient return precautions given.  She verbalized understanding and agreement to today's plan.  Final Clinical Impressions(s) / ED Diagnoses   Final diagnoses:  Upper respiratory tract infection, unspecified type    ED Discharge Orders         Ordered    albuterol (PROVENTIL HFA;VENTOLIN HFA) 108 (90 Base) MCG/ACT inhaler  Every 6 hours PRN     09/17/18 1457           Eyvonne Mechanic, PA-C 09/17/18 1504    Donnetta Hutching, MD 09/18/18 [phone]

## 2018-09-17 NOTE — ED Triage Notes (Signed)
Pt reports flu like symptoms for about 2 weeks, [facility] and was given steroid shot and abx to take. Pt reports worsening symptoms of productive cough, congestion, SOB, CP.

## 2018-09-17 NOTE — Discharge Instructions (Addendum)
Please read attached information. If you experience any new or worsening signs or symptoms please return to the emergency room for evaluation. Please follow-up with your primary care provider or specialist as discussed. Please use medication prescribed only as directed and discontinue taking if you have any concerning signs or symptoms.   °

## 2018-09-17 NOTE — ED Notes (Signed)
Patient verbalizes understanding of discharge instructions. Opportunity for questioning and answers were provided. Armband removed by staff, pt discharged from ED. Prescriptions reviewed. Pt ambulatory to lobby.  

## 2018-09-17 NOTE — ED Notes (Signed)
Patient transported to X-ray 

## 2019-03-05 ENCOUNTER — Other Ambulatory Visit: Payer: Self-pay | Admitting: Physician Assistant

## 2019-03-05 DIAGNOSIS — R1032 Left lower quadrant pain: Secondary | ICD-10-CM

## 2019-08-21 ENCOUNTER — Ambulatory Visit: Payer: 59 | Attending: Student | Admitting: Physical Therapy

## 2019-09-17 ENCOUNTER — Encounter: Payer: Self-pay | Admitting: Physical Therapy

## 2019-09-17 ENCOUNTER — Ambulatory Visit: Payer: 59 | Attending: Orthopedic Surgery | Admitting: Physical Therapy

## 2019-09-17 ENCOUNTER — Other Ambulatory Visit: Payer: Self-pay

## 2019-09-17 DIAGNOSIS — R262 Difficulty in walking, not elsewhere classified: Secondary | ICD-10-CM | POA: Insufficient documentation

## 2019-09-17 DIAGNOSIS — M25571 Pain in right ankle and joints of right foot: Secondary | ICD-10-CM | POA: Insufficient documentation

## 2019-09-17 DIAGNOSIS — M25671 Stiffness of right ankle, not elsewhere classified: Secondary | ICD-10-CM

## 2019-09-17 NOTE — Therapy (Signed)
[facility] [place], [place], [address] Phone: [phone]   Fax:  [phone]  Physical Therapy Evaluation  Patient Details  Name: [name] MRN: [id number] Date of Birth: [birth year] Referring Provider (PT): Wylene Simmer, MD   Encounter Date: 09/17/2019  PT End of Session - 09/17/19 2043    Visit Number  1    Number of Visits  8    Date for PT Re-Evaluation  10/22/19    Authorization Type  progress note every 10th visit, KX modifier at 15th visit    PT Start Time  1030    PT Stop Time  1116    PT Time Calculation (min)  46 min    Activity Tolerance  Patient tolerated treatment well    Behavior During Therapy  [facility] for tasks assessed/performed       Past Medical History:  Diagnosis Date  . AMA (advanced maternal age) multigravida 9+   . Anxiety   . Bulging of cervical intervertebral disc   . Depression   . H/O candidiasis   . H/O cystitis    1-2 x a year  . H/O pyelonephritis   . Headache(784.0)    migraines   . IBS (irritable bowel syndrome)   . Kidney stone     Past Surgical History:  Procedure Laterality Date  . CESAREAN SECTION    . left knee surgery    . WISDOM TOOTH EXTRACTION      There were no vitals filed for this visit.   Subjective Assessment - 09/17/19 1029    Subjective  COVID-19 screening performed upon arrival. Patient arrives to physical therapy with reports of right foot and ankle pain, increased ankle swelling, and pain with walking that began about October 2020. Patient is unable to recall mechanism of injury and only recalls doing yard work the days before it began to hurt. Patient was casted, in a boot, and is now in an ASO for the past three weeks. Patient is compliant with HEP provided by MD as well as compliant with wearing ASO daily. Patient reports she is able to perform all ADLs but with pain. Patient reports the most difficult task is walking on uneven surfaces. Patient  reports constant 1-2/10 pain. Patient's goals are to decrease pain, improve movement, improve strength, stand longer, and have less difficulties with home activities.    Limitations  Standing;Walking    Diagnostic tests  MRI: plantar fasica torn per patient report    Patient Stated Goals  decrease pain, walk on uneven surfaces to garden areas    Currently in Pain?  Yes    Pain Score  2     Pain Location  Ankle    Pain Orientation  Right    Pain Descriptors / Indicators  Aching    Pain Type  Chronic pain    Pain Onset  1 to 4 weeks ago    Pain Frequency  Constant    Aggravating Factors   walking on uneven surfaces    Pain Relieving Factors  rest    Effect of Pain on Daily Activities  pain with activities.         [facility] PT Assessment - 09/17/19 0001      Assessment   Medical Diagnosis  tendonitis of posterior tibialis tendon    Referring Provider (PT)  Wylene Simmer, MD    Onset Date/Surgical Date  --   October 2020   Prior Therapy  no  Precautions   Precautions  None      Balance Screen   Has the patient fallen in the past 6 months  No    Has the patient had a decrease in activity level because of a fear of falling?   No    Is the patient reluctant to leave their home because of a fear of falling?   No      Home Public house managernvironment   Living Environment  Private residence      Prior Function   Level of Independence  Independent      ROM / Strength   AROM / PROM / Strength  AROM;PROM;Strength      AROM   AROM Assessment Site  Ankle    Right/Left Ankle  Right    Right Ankle Dorsiflexion  -5   5 degrees knee flexed   Right Ankle Plantar Flexion  30    Right Ankle Inversion  15    Right Ankle Eversion  6      PROM   PROM Assessment Site  Ankle    Right/Left Ankle  Right    Right Ankle Dorsiflexion  0   10 degrees knee bent   Right Ankle Plantar Flexion  40    Right Ankle Inversion  30    Right Ankle Eversion  10      Strength   Strength Assessment Site  Ankle     Right/Left Ankle  Right    Right Ankle Dorsiflexion  4+/5    Right Ankle Plantar Flexion  4/5    Right Ankle Inversion  4+/5    Right Ankle Eversion  4+/5      Palpation   Palpation comment  tenderness to palpation to plantar fascia of R foot and navicular      Ambulation/Gait   Gait Pattern  Step-through pattern;Decreased stride length;Decreased stance time - right;Decreased dorsiflexion - right;Right foot flat    Ambulation Surface  Level;Indoor                Objective measurements completed on examination: See above findings.              PT Education - 09/17/19 2041    Education Details  towel crunches, heel raises, toe raises in sitting, gastroc and soleus stretch    Person(s) Educated  Patient    Methods  Explanation;Demonstration;Verbal cues;Handout    Comprehension  Verbalized understanding;Returned demonstration          PT Long Term Goals - 09/17/19 1239      PT LONG TERM GOAL #1   Title  Patient will be independent with HEP    Time  4    Period  Weeks    Status  New      PT LONG TERM GOAL #2   Title  Patient will demonstrate 8+ degrees of right ankle DF to improve gait mechanics.    Time  4    Period  Weeks    Status  New      PT LONG TERM GOAL #3   Title  Patient will demonstrate 5/5 right ankle MMT in all planes to improve stability during functional tasks.    Time  4    Period  Weeks    Status  New      PT LONG TERM GOAL #4   Title  Patient will report ability to ambulate on uneven surfaces with right ankle pain less than or equal to 1/10.    Time  4    Period  Weeks    Status  New      PT LONG TERM GOAL #5   Title  Patient will report ability to perform ADLs and care giving activities with right ankle pain less than or equal to 1/10.    Time  4    Period  Weeks    Status  New             Plan - 09/17/19 2106    Clinical Impression Statement  Patient is a 53 year old female who presents to physical therapy with  right ankle pain, decreased right ankle ROM and abnormal gait pattern. Patient tender to palpation to right navicular as well as plantar surface of the right foot. Patient ambulates with an antalgic gait pattern with decreased right stance time, decreased left step length, decreased right DF, and decreased gait speed. Patient and PT discussed HEP as well as plan of care to address deficits. Patient would benefit from skilled physical therapy to address deficits and address patient's goals.    Personal Factors and Comorbidities  Time since onset of injury/illness/exacerbation;Age    Examination-Activity Limitations  Geophysical data processor for Others;Stand;Stairs;Squat    Examination-Participation Restrictions  Cleaning;Yard Work    Stability/Clinical Decision Making  Stable/Uncomplicated    Optometrist  Low    Rehab Potential  Good    PT Frequency  2x / week    PT Duration  4 weeks    PT Treatment/Interventions  ADLs/Self Care Home Management;Iontophoresis 4mg /ml Dexamethasone;Cryotherapy;Electrical Stimulation;Moist Heat;Ultrasound;Gait training;Stair training;Functional mobility training;Therapeutic activities;Therapeutic exercise;Balance training;Neuromuscular re-education;Patient/family education;Manual techniques;Passive range of motion;Dry needling;Taping;Vasopneumatic Device    PT Next Visit Plan  nustep, gentle strengthening of right ankle, progress to eccentric strengthening, modalities PRN for pain relief    PT Home Exercise Plan  see patient education section    Consulted and Agree with Plan of Care  Patient       Patient will benefit from skilled therapeutic intervention in order to improve the following deficits and impairments:  Abnormal gait, Decreased activity tolerance, Decreased balance, Difficulty walking, Decreased range of motion, Decreased strength, Pain  Visit Diagnosis: Stiffness of right ankle, not elsewhere classified - Plan: PT plan of care  cert/re-cert  Pain in right ankle and joints of right foot - Plan: PT plan of care cert/re-cert  Difficulty in walking, not elsewhere classified - Plan: PT plan of care cert/re-cert     Problem List Patient Active Problem List   Diagnosis Date Noted  . Thyroid nodule 12/28/2012  . Anxiety     12/30/2012, PT, DPT 09/17/2019, 9:11 PM  [facility] [address] [place], [place], [place] Phone: [phone]   Fax:  [phone]  Name: KAIANA MARION MRN: Vedia Pereyra Date of Birth: March 27, 1966

## 2019-09-25 ENCOUNTER — Ambulatory Visit: Payer: 59 | Admitting: Physical Therapy

## 2021-04-23 ENCOUNTER — Ambulatory Visit: Payer: Self-pay | Admitting: *Deleted

## 2021-04-23 NOTE — Telephone Encounter (Signed)
Pt called the community line looking for advice regarding her 23 month old grandson, caller stated her grandson is having trouble putting pressure on his leg and wanted to know if she should take him to UC.   Called patient back regarding advise of grandson. No answer, left message to call back #[phone].

## 2021-04-24 NOTE — Telephone Encounter (Signed)
Closing out chart. Three attempts made to try and get in touch with pt's grandmother.

## 2021-12-17 ENCOUNTER — Other Ambulatory Visit: Payer: Self-pay | Admitting: Otolaryngology

## 2022-05-06 ENCOUNTER — Other Ambulatory Visit: Payer: Self-pay | Admitting: Internal Medicine

## 2022-05-06 ENCOUNTER — Ambulatory Visit
Admission: RE | Admit: 2022-05-06 | Discharge: 2022-05-06 | Disposition: A | Discharge status: Home / Self Care | Payer: 59 | Source: Ambulatory Visit | Attending: Internal Medicine | Admitting: Internal Medicine

## 2022-05-06 ENCOUNTER — Other Ambulatory Visit: Payer: Self-pay | Admitting: Family Medicine

## 2022-05-06 DIAGNOSIS — E042 Nontoxic multinodular goiter: Secondary | ICD-10-CM

## 2022-05-12 ENCOUNTER — Other Ambulatory Visit: Payer: Self-pay | Admitting: Internal Medicine

## 2022-05-16 ENCOUNTER — Other Ambulatory Visit: Payer: Self-pay | Admitting: Internal Medicine

## 2022-05-16 ENCOUNTER — Other Ambulatory Visit: Payer: Self-pay | Admitting: Interventional Radiology

## 2022-05-16 DIAGNOSIS — E042 Nontoxic multinodular goiter: Secondary | ICD-10-CM

## 2022-06-22 ENCOUNTER — Ambulatory Visit
Admission: RE | Admit: 2022-06-22 | Discharge: 2022-06-22 | Disposition: A | Discharge status: Home / Self Care | Payer: 59 | Source: Ambulatory Visit | Attending: Internal Medicine | Admitting: Internal Medicine

## 2022-06-22 ENCOUNTER — Other Ambulatory Visit: Payer: Self-pay | Admitting: Interventional Radiology

## 2022-06-22 DIAGNOSIS — E042 Nontoxic multinodular goiter: Secondary | ICD-10-CM

## 2022-06-22 HISTORY — PX: IR RADIOLOGIST EVAL & MGMT: IMG5224

## 2022-06-22 NOTE — Consult Note (Signed)
Chief Complaint: Patient was seen in consultation today for symptomatic thyroid nodule  Referring Physician(s): Minette Brine, MD  History of Present Illness: [name] is a 56 y.o. female with history of multinodular goiter with dominant left sided nodule presenting via virtual telephone consultation for consideration of radiofrequency ablation.  She was first diagnosed with a thyroid nodule over a decade ago.  The nodule was first biopsied in 2014 (benign but low cellularity) and again in 2017 (Bethesda II).   In 2015, the nodule measured up to 3.6 cm, now 4.2.  She endorses primarily cosmetic symptoms noticing a bulge about her left neck.  She complains of a pressure sensation on her neck with lying in certain positions.  She denies any problems swallowing, breathing, or changes in her voice.  No prior history of thyroid dysfunction.  She is undergoing further evaluation for possible hypercalcemia/parathyroid dysfunction as recent calcium level was noted to be 10.8 - she is following up with Dr. Irene Pap with [facility].     Thyroid Symptom Score: 8/10  Thyroid Cosmetic Score:  Readily detected cosmetic problem (Grade 4).  Past Medical History:  Diagnosis Date   AMA (advanced maternal age) multigravida 35+    Anxiety    Bulging of cervical intervertebral disc    Depression    H/O candidiasis    H/O cystitis    1-2 x a year   H/O pyelonephritis    Headache(784.0)    migraines    IBS (irritable bowel syndrome)    Kidney stone     Past Surgical History:  Procedure Laterality Date   CESAREAN SECTION     left knee surgery     WISDOM TOOTH EXTRACTION      Allergies: Doxycycline  Medications: Prior to Admission medications   Medication Sig Start Date End Date Taking? Authorizing Provider  albuterol (PROVENTIL HFA;VENTOLIN HFA) 108 (90 Base) MCG/ACT inhaler Inhale 1-2 puffs into the lungs every 6 (six) hours as needed for  wheezing or shortness of breath. 09/17/18   Hedges, Dellis Filbert, PA-C  ALPRAZolam Duanne Moron) 0.5 MG tablet Take 0.5 mg by mouth as needed for anxiety.    [provider]  atorvastatin (LIPITOR) 10 MG tablet Take 10 mg by mouth daily.    [provider]  baclofen (LIORESAL) 20 MG tablet Take 1 tablet (20 mg total) by mouth 3 (three) times daily. 06/08/15   Loleta Chance, MD  cyclobenzaprine (FLEXERIL) 10 MG tablet Take 1 tablet (10 mg total) by mouth 2 (two) times daily as needed for muscle spasms. 06/25/16   Gloriann Loan, PA-C  HYDROcodone-acetaminophen (NORCO/VICODIN) 5-325 MG tablet Take 1 tablet by mouth every 6 (six) hours as needed. 02/13/17   Recardo Evangelist, PA-C  ibuprofen (ADVIL,MOTRIN) 600 MG tablet Take 1 tablet (600 mg total) by mouth every 6 (six) hours as needed. 02/13/17   Recardo Evangelist, PA-C  methocarbamol (ROBAXIN) 500 MG tablet Take 1 tablet (500 mg total) by mouth at bedtime and may repeat dose one time if needed. 02/13/17   Recardo Evangelist, PA-C  naproxen (NAPROSYN) 500 MG tablet Take 1 tablet (500 mg total) by mouth 2 (two) times daily. 06/25/16   Gloriann Loan, PA-C  oxyCODONE-acetaminophen (PERCOCET/ROXICET) 5-325 MG tablet Take 1 tablet by mouth every 4 (four) hours as needed for severe pain. 04/29/17   Recardo Evangelist, PA-C  traMADol (ULTRAM) 50 MG tablet Take 1 tablet (50 mg total) by mouth every 6 (six) hours  as needed. 06/08/15   Loleta Chance, MD     Family History  Problem Relation Age of Onset   Depression Sister    Diabetes Brother    Depression Brother    Heart disease Maternal Aunt        bypass surgery   Cancer Maternal Aunt        intestinal     Social History   Socioeconomic History   Marital status: Married    Spouse name: Not on file   Number of children: Not on file   Years of education: Not on file   Highest education level: Not on file  Occupational History   Not on file  Tobacco Use   Smoking status: Former   Smokeless tobacco:  Current    Last attempt to quit: 10/04/2011  Substance and Sexual Activity   Alcohol use: Yes    Comment: socially   Drug use: No   Sexual activity: Yes    Birth control/protection: None  Other Topics Concern   Not on file  Social History Narrative   Not on file   Social Determinants of Health   Financial Resource Strain: Not on file  Food Insecurity: Not on file  Transportation Needs: Not on file  Physical Activity: Not on file  Stress: Not on file  Social Connections: Not on file     Review of Systems: A 12 point ROS discussed and pertinent positives are indicated in the HPI above.  All other systems are negative.  Vital Signs: No vitals for this virtual visit.  No physical examination was performed in lieu of virtual telephone clinic visit.  Imaging: US Thyroid 05/06/22   4.2 x 3.1 x 2.3 cm = 16 cc   Labs: 06/20/22  CBC WBC 7.8, Hb 14.3, Hct 43.8, Plt 276  Coags None recent  TFTs Serum TSH: 2.09 (02/29/12) Serum free T4: 1.0 (05/03/18) Serum T3: none available Thyroperoxidase Antibody: none available Thyroglobulin Antibody: none available Calcitonin: none available   Prior Thyroid FNA: 05/10/16 CONSISTENT WITH BENIGN FOLLICULAR NODULE (BETHESDA CATEGORY II).    Assessment and Plan: 56 year old female with presumed benign left sided symptomatic (cosmetic score 4, symptom score 8/10) thyroid nodule (left, 4.2 cm, 16 cc).  She is interested in pursuing thyroid nodule radiofrequency ablation in hopes of preserving her normal thyroid tissue, avoiding surgery, and avoiding thyroid supplementation.  Her anatomy and presentation make her a great candidate for RFA in hopes of significant size reduction of the nodule.  She is currently undergoing evaluation for possible hypercalcemia.    -plan for repeat US guided thyroid FNA as 2 negative biopsies are required per societal guidelines prior to thyroid RFA -once biopsy resulted, will follow up to discuss  eligibility/scheduling for possible RFA    Electronically Signed: Suzette Battiest, MD 06/22/2022, 9:27 AM   I spent a total of  40 Minutes  in virtual telephone clinical consultation, greater than 50% of which was counseling/coordinating care for symptomatic thyroid nodule.

## 2022-07-01 ENCOUNTER — Inpatient Hospital Stay: Admission: RE | Admit: 2022-07-01 | Payer: 59 | Source: Ambulatory Visit

## 2022-07-20 ENCOUNTER — Telehealth: Payer: 59

## 2024-05-31 ENCOUNTER — Other Ambulatory Visit: Payer: Self-pay | Admitting: Obstetrics and Gynecology

## 2024-05-31 DIAGNOSIS — N6311 Unspecified lump in the right breast, upper outer quadrant: Secondary | ICD-10-CM

## 2024-06-06 ENCOUNTER — Encounter

## 2024-06-06 ENCOUNTER — Other Ambulatory Visit

## 2024-08-20 ENCOUNTER — Ambulatory Visit (HOSPITAL_COMMUNITY)
Admission: RE | Admit: 2024-08-20 | Discharge: 2024-08-20 | Disposition: A | Discharge status: Home / Self Care | Source: Ambulatory Visit | Attending: Vascular Surgery | Admitting: Vascular Surgery

## 2024-08-20 ENCOUNTER — Other Ambulatory Visit (HOSPITAL_COMMUNITY): Payer: Self-pay | Admitting: Medical

## 2024-08-20 DIAGNOSIS — M25562 Pain in left knee: Secondary | ICD-10-CM | POA: Insufficient documentation
